# Patient Record
Sex: Female | Born: 1990 | Race: White | Hispanic: Yes | Marital: Single | Smoking: Never smoker
Health system: Southern US, Community
[De-identification: ages and names within clinical notes are randomized; demographics above are authoritative.]

## PROBLEM LIST (undated history)

## (undated) DIAGNOSIS — K819 Cholecystitis, unspecified: Secondary | ICD-10-CM

## (undated) DIAGNOSIS — O24419 Gestational diabetes mellitus in pregnancy, unspecified control: Secondary | ICD-10-CM

## (undated) DIAGNOSIS — K219 Gastro-esophageal reflux disease without esophagitis: Secondary | ICD-10-CM

## (undated) HISTORY — PX: NO PAST SURGERIES: SHX2092

---
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
## 2011-01-10 NOTE — L&D Delivery Note (Signed)
Delivery Note At 12:24 AM a viable female was delivered via Vaginal, Spontaneous Delivery (Presentation: OA ). Vigorous infant to mother's abd No nuchal APGAR: 8, 9; weight 6 lb 11 oz (3033 g).   Placenta status: Intact, Spontaneous by Deanna Harrison. Cord: 3 vessels with the following complications: None.    Anesthesia: Local  Episiotomy: None Lacerations: left side wall and 2nd degree perineal Suture Repair: 3.0 vicryl Est. Blood Loss (mL): 500cc  Mom to postpartum.  Baby to nursery-stable.  Deanna Peavler E. 04/17/2011, 1:07 AM

## 2011-02-02 LAB — HIV ANTIBODY (ROUTINE TESTING W REFLEX): HIV: NONREACTIVE

## 2011-04-14 ENCOUNTER — Encounter (HOSPITAL_COMMUNITY): Payer: Self-pay | Admitting: *Deleted

## 2011-04-14 ENCOUNTER — Emergency Department (HOSPITAL_COMMUNITY)
Admission: EM | Admit: 2011-04-14 | Discharge: 2011-04-14 | Disposition: A | Discharge status: Home / Self Care | Payer: Self-pay | Attending: Emergency Medicine | Admitting: Emergency Medicine

## 2011-04-14 DIAGNOSIS — O479 False labor, unspecified: Secondary | ICD-10-CM | POA: Insufficient documentation

## 2011-04-14 DIAGNOSIS — R109 Unspecified abdominal pain: Secondary | ICD-10-CM | POA: Insufficient documentation

## 2011-04-14 DIAGNOSIS — J45909 Unspecified asthma, uncomplicated: Secondary | ICD-10-CM | POA: Insufficient documentation

## 2011-04-14 DIAGNOSIS — K219 Gastro-esophageal reflux disease without esophagitis: Secondary | ICD-10-CM | POA: Insufficient documentation

## 2011-04-14 DIAGNOSIS — M549 Dorsalgia, unspecified: Secondary | ICD-10-CM | POA: Insufficient documentation

## 2011-04-14 HISTORY — DX: Gastro-esophageal reflux disease without esophagitis: K21.9

## 2011-04-14 NOTE — Discharge Instructions (Signed)

## 2011-04-14 NOTE — ED Provider Notes (Signed)
History     CSN: [id number]  Arrival date & time 04/14/11  2140   First MD Initiated Contact with Patient 04/14/11 2207      Chief Complaint  Patient presents with  . Contractions     HPI Pt states she began having back pain and lower abdominal pain this morning. States this is her first pregnancy. OBGYN in [place], [place]. Assoicated with [facility]. States her water has not broken   Past Medical History  Diagnosis Date  . Asthma   . GERD (gastroesophageal reflux disease)     Past Surgical History  Procedure Date  . No past surgeries     History reviewed. No pertinent family history.  History  Substance Use Topics  . Smoking status: Never Smoker   . Smokeless tobacco: Never Used  . Alcohol Use: No    OB History    Grav Para Term Preterm Abortions TAB SAB Ect Mult Living   2    1  1    0      Review of Systems  Unable to perform ROS: Other  All other systems reviewed and are negative.    Allergies  Review of patient's allergies indicates no known allergies.  Home Medications  No current outpatient prescriptions on file.  BP 115/66  Pulse 102  Temp(Src) 98.5 F (36.9 C) (Oral)  Resp 22  Ht 5' 0.5" (1.537 m)  Wt 175 lb (79.379 kg)  BMI 33.61 kg/m2  SpO2 100%  Breastfeeding? Unknown  Physical Exam  Nursing note and vitals reviewed. Constitutional: She is oriented to person, place, and time. She appears well-developed and well-nourished. No distress.  HENT:  Head: Normocephalic and atraumatic.  Eyes: Pupils are equal, round, and reactive to light.  Neck: Normal range of motion.  Cardiovascular: Normal rate and intact distal pulses.   Pulmonary/Chest: No respiratory distress.  Abdominal: Normal appearance and bowel sounds are normal. She exhibits no distension. There is no tenderness.       Gravid uterus.  No discharge.  1 cm minimal effacement.  Musculoskeletal: Normal range of motion.  Neurological: She is alert and oriented to person, place, and  time. No cranial nerve deficit.  Skin: Skin is warm and dry. No rash noted.  Psychiatric: She has a normal mood and affect. Her behavior is normal.    ED Course  Procedures (including critical care time) The rapid response nurse came to the hospital and evaluated the patient.  Patient placed on fetal monitor her no significant contractions noted.  Patient 1 cm and not dilated or water has not broken.  OB on call was consulted and felt patient could be sent home.  Patient informed of plan and will be instructed to return to [facility] should she have any further problems otherwise she was given a phone number to call Monday for followup for her pregnancy.  Labs Reviewed  ANTIBODY SCREEN  HIV ANTIBODY (ROUTINE TESTING)  GC/CHLAMYDIA PROBE AMP, GENITAL  GC/CHLAMYDIA PROBE AMP, GENITAL  STREP B DNA PROBE  RUBELLA ANTIBODY, IGM  HEPATITIS B SURFACE ANTIGEN  RPR  GC/CHLAMYDIA PROBE AMP, GENITAL   No results found.   1. Braxton Hick's contraction       MDM         Nelia Shi, MD 04/14/11 2326

## 2011-04-14 NOTE — ED Notes (Signed)
No rx given, pt and family voiced understanding to f/u with [facility] for primary [facility]YN in the area.  Also voiced understanding to f/u with [facility] for any other OB emergencies.

## 2011-04-14 NOTE — ED Notes (Signed)
Pt states she began having back pain and lower abdominal pain this morning.  States this is her first pregnancy.  OBGYN in [place], [place].  Assoicated with [facility].  States her water has not broken

## 2011-04-14 NOTE — ED Notes (Signed)
OB rapid response nurse at bedside, to check cervix and assess pt

## 2011-04-14 NOTE — ED Notes (Signed)
OB Rapid Response notified of this pt, is on her way

## 2011-04-14 NOTE — ED Notes (Signed)
OB Rapid response RN in dept

## 2011-04-15 NOTE — Progress Notes (Signed)
Pt in to [facility] c/o contraction. Pt is G2P0 @ 38.5 wks.  Pt reports good FM, no bleeding or leaking of fluid.  Pt states that ucs started around noon w/ 7/10 on pain scale.  Pt has received care in [facility] but recently moved to [place].  Pt brought PNR with her

## 2011-04-15 NOTE — Progress Notes (Signed)
ED MD notified of Dr. Dion Body order to d/c home.  Antenatal d/c orders reviewed with pt and pt and FOR verbalized understanding.  Pt instructed to call Monday am to get appointment for OB care here in [place] @ [facility] clinic and to return to [facility] for further labor concerns

## 2011-04-15 NOTE — Progress Notes (Signed)
Notified Dr. Dion Body (OB unassigned) of pt in [facility], pt OB history and current status.  MD ok w/ pt being d/c home to follow up for [facility] care with [facility].

## 2011-04-16 ENCOUNTER — Inpatient Hospital Stay (HOSPITAL_COMMUNITY)
Admission: RE | Admit: 2011-04-16 | Discharge: 2011-04-19 | DRG: 775 | Disposition: A | Discharge status: Home / Self Care | Payer: Medicaid Other | Source: Ambulatory Visit | Attending: Obstetrics and Gynecology | Admitting: Obstetrics and Gynecology

## 2011-04-16 ENCOUNTER — Encounter (HOSPITAL_COMMUNITY): Payer: Self-pay | Admitting: *Deleted

## 2011-04-16 DIAGNOSIS — O4290 Premature rupture of membranes, unspecified as to length of time between rupture and onset of labor, unspecified weeks of gestation: Secondary | ICD-10-CM | POA: Diagnosis present

## 2011-04-16 LAB — CBC
HCT: 31.8 % — ABNORMAL LOW (ref 36.0–46.0)
Hemoglobin: 9.8 g/dL — ABNORMAL LOW (ref 12.0–15.0)
MCHC: 30.8 g/dL (ref 30.0–36.0)
RBC: 4.34 MIL/uL (ref 3.87–5.11)

## 2011-04-16 MED ORDER — ONDANSETRON HCL 4 MG/2ML IJ SOLN: 4.0000 mg | Freq: Four times a day (QID) | INTRAMUSCULAR | Status: DC | PRN

## 2011-04-16 MED ORDER — FLEET ENEMA 7-19 GM/118ML RE ENEM: 1.0000 | ENEMA | RECTAL | Status: DC | PRN

## 2011-04-16 MED ORDER — LACTATED RINGERS IV SOLN: 500.0000 mL | INTRAVENOUS | Status: DC | PRN

## 2011-04-16 MED ORDER — ACETAMINOPHEN 325 MG PO TABS: 650.0000 mg | ORAL_TABLET | ORAL | Status: DC | PRN

## 2011-04-16 MED ORDER — LIDOCAINE HCL (PF) 1 % IJ SOLN
30.0000 mL | INTRAMUSCULAR | Status: DC | PRN
  Administered 2011-04-17: 30 mL via SUBCUTANEOUS
  Filled 2011-04-16: qty 30

## 2011-04-16 MED ORDER — OXYCODONE-ACETAMINOPHEN 5-325 MG PO TABS: 1.0000 | ORAL_TABLET | ORAL | Status: DC | PRN

## 2011-04-16 MED ORDER — CITRIC ACID-SODIUM CITRATE 334-500 MG/5ML PO SOLN: 30.0000 mL | ORAL | Status: DC | PRN

## 2011-04-16 MED ORDER — LACTATED RINGERS IV SOLN
INTRAVENOUS | Status: DC
  Administered 2011-04-16 (×2): via INTRAVENOUS

## 2011-04-16 MED ORDER — IBUPROFEN 600 MG PO TABS: 600.0000 mg | ORAL_TABLET | Freq: Four times a day (QID) | ORAL | Status: DC | PRN

## 2011-04-16 MED ORDER — TERBUTALINE SULFATE 1 MG/ML IJ SOLN: 0.2500 mg | Freq: Once | INTRAMUSCULAR | Status: AC | PRN

## 2011-04-16 MED ORDER — OXYTOCIN 20 UNITS IN LACTATED RINGERS INFUSION - SIMPLE
1.0000 m[IU]/min | INTRAVENOUS | Status: DC
  Administered 2011-04-16: 2 m[IU]/min via INTRAVENOUS
  Filled 2011-04-16: qty 1000

## 2011-04-16 MED ORDER — OXYTOCIN 20 UNITS IN LACTATED RINGERS INFUSION - SIMPLE: 1.0000 m[IU]/min | INTRAVENOUS | Status: DC

## 2011-04-16 MED ORDER — OXYTOCIN BOLUS FROM INFUSION
500.0000 mL | Freq: Once | INTRAVENOUS | Status: DC
  Filled 2011-04-16: qty 500

## 2011-04-16 MED ORDER — OXYTOCIN 20 UNITS IN LACTATED RINGERS INFUSION - SIMPLE
125.0000 mL/h | Freq: Once | INTRAVENOUS | Status: AC
  Administered 2011-04-17: 999 mL/h via INTRAVENOUS

## 2011-04-16 NOTE — Progress Notes (Signed)
Patient ID: Asha Grumbine, female   DOB: 07/01/1990, 21 y.o.   MRN: [id number] Moniqua Engebretsen is a 21 y.o. G2P0010 at [redacted]w[redacted]d admitted for SROM  Subjective: Some discomfort with UCs. Wants no pain med. Up walking. Leaking clear fluid.  Objective: BP 81/55  Pulse 121  Temp(Src) 97.5 F (36.4 C) (Oral)  Resp 20  Ht 5' 0.5" (1.537 m)  Wt 79.379 kg (175 lb)  BMI 33.61 kg/m2  Breastfeeding? Unknown  Fetal Heart FHR: 140 bpm, variability: moderate,  accelerations:  Present,  decelerations:  Absent   Contractions: irreg, q 1 1/2-4  SVE:   Dilation: 2 Effacement (%): 80 Station: -2 Exam by:: D Saryiah Bencosme CNM  Assessment / Plan:  Labor: Progressive effacement Fetal Wellbeing: Cat 1 Pain Control:  Coping well Expected mode of delivery: NSVD  Regan Mcbryar 04/16/2011, 3:34 PM

## 2011-04-16 NOTE — Progress Notes (Signed)
Deanna Harrison is a 21 y.o. G2P0010 at [redacted]w[redacted]d by ultrasound admitted for PROM  Subjective: Breathing with contractions, declines pain medications at this time  Objective: BP 113/68  Pulse 98  Temp(Src) 98.4 F (36.9 C) (Oral)  Resp 18  Ht 5' 0.5" (1.537 m)  Wt 175 lb (79.379 kg)  BMI 33.61 kg/m2  Breastfeeding? Unknown      FHT:  FHR: 130 bpm, variability: moderate,  accelerations:  Present,  decelerations:  Absent UC:   regular, every 3-4 minutes SVE:   Dilation: 2 Effacement (%): 80 Station: -2 Exam by:: Deanna Coe RN  Labs: Lab Results  Component Value Date   WBC 12.6* 04/16/2011   HGB 9.8* 04/16/2011   HCT 31.8* 04/16/2011   MCV 73.3* 04/16/2011   PLT 335 04/16/2011    Assessment / Plan: PROM at term  Labor: Pitocin started at 1900 Preeclampsia:  n/a Fetal Wellbeing:  Category I Pain Control:  Labor support without medications I/D:  n/a Anticipated MOD:  NSVD  Deanna Cumberland E. 04/16/2011, 8:29 PM

## 2011-04-16 NOTE — MAU Provider Note (Signed)
Attestation of Attending Supervision of Advanced Practitioner: Evaluation and management procedures were performed by the PA/NP/CNM/OB Fellow under my supervision/collaboration. Chart reviewed and agree with management and plan.  Kemuel Buchmann V 04/16/2011 12:17 PM

## 2011-04-16 NOTE — MAU Provider Note (Signed)
See admission H&P for G2P0010 at 38wks with SROM

## 2011-04-16 NOTE — Progress Notes (Signed)
Patient ID: Deanna Harrison, female   DOB: Nov 10, 1990, 21 y.o.   MRN: [id number] Still walking around in room and appears comfortable. Leaking small amount. Hungry.  Afebrile FHR reactive UCs irregular.   Discussed POC with pt who agrees to start pitocin augmentation after eating light meal.

## 2011-04-16 NOTE — Progress Notes (Signed)
Pt up walking in room by choice with sister at bedside for support. Continues to labor without pain medication interventions per pt request.

## 2011-04-16 NOTE — H&P (Signed)
Attestation of Attending Supervision of Advanced Practitioner: Evaluation and management procedures were performed by the PA/NP/CNM/OB Fellow under my supervision/collaboration. Chart reviewed and agree with management and plan.  Veronnica Hennings V 04/16/2011 12:16 PM

## 2011-04-16 NOTE — H&P (Signed)
Deanna Harrison is a 21 y.o. female at [redacted]w[redacted]d by LMP confirmed by 13 wk [place] presenting for SROM today 0915 large amount clear fluid. PNC in [place], uncomplicated, records available  Maternal Medical History:  Reason for admission: Reason for admission: rupture of membranes.  Reason for Admission:   nauseaContractions: Onset was 1-2 hours ago.   Frequency: irregular.    Fetal activity: Perceived fetal activity is normal.   Last perceived fetal movement was within the past hour.    Prenatal complications: no prenatal complications Prenatal Complications - Diabetes: none.    OB History    Grav Para Term Preterm Abortions TAB SAB Ect Mult Living   2    1  1    0     Past Medical History  Diagnosis Date  . Asthma   . GERD (gastroesophageal reflux disease)    Past Surgical History  Procedure Date  . No past surgeries    Family History: family history is not on file. Social History:  reports that she has never smoked. She has never used smokeless tobacco. She reports that she does not drink alcohol or use illicit drugs.  Review of Systems  Constitutional: Negative for fever.  Eyes: Negative for blurred vision.  Gastrointestinal: Negative for nausea and vomiting.  Genitourinary: Negative for dysuria.  Skin: Negative for rash.  Neurological: Negative for headaches.    Dilation: 2 Effacement (%): 50 Station: -2 Exam by:: d Evonte Prestage, cnm Temperature 98.6 F (37 C), temperature source Oral, resp. rate 20, height 5' 0.5" (1.537 m), weight 79.379 kg (175 lb), unknown if currently breastfeeding. Maternal Exam:  Uterine Assessment: Contraction strength is moderate.  Contraction frequency is irregular.   Abdomen: Estimated fetal weight is EFW 7#.   Fetal presentation: vertex  Introitus: Normal vulva. Vagina is positive for vaginal discharge.  Ferning test: positive.  Nitrazine test: not done. Amniotic fluid character: clear.  Pelvis: adequate for delivery.   Cervix: Cervix evaluated  by digital exam.   Posterior, 2/50/-2, large amount clear AF  Physical Exam  Constitutional: She is oriented to person, place, and time. She appears well-developed and well-nourished. No distress.  HENT:  Head: Normocephalic.  Neck: Normal range of motion.  Respiratory: Breath sounds normal.  GI: There is no tenderness.  Genitourinary: Uterus normal. Vaginal discharge found.       Thick white discharge  Musculoskeletal: Normal range of motion.  Neurological: She is alert and oriented to person, place, and time.  Skin: Skin is warm and dry.  Psychiatric: She has a normal mood and affect.    Prenatal labs: ABO, Rh: --/--/A POS (06/15 1315) Antibody: Negative (09/27 0000) Rubella: Immune (09/27 0000) RPR: Nonreactive (09/26 0000)  HBsAg: Negative (09/27 0000)  HIV: Non-reactive (01/24 0000)  GBS: Negative (03/18 0000)  1 hr gluc 138 3HOGTT: 69/144/122/89  FHR: 130 baseline, moderate variability, assessing UC's: palpate mild-moderate, assessing frequency  Assessment/Plan: G1P0010 at [redacted]w[redacted]d with SROM assessing for early labor Fetal parameters reassuring  Admit for expectant management   Freman Lapage 04/16/2011, 10:50 AM

## 2011-04-16 NOTE — Progress Notes (Signed)
Callen Zuba is a 21 y.o. G2P0010 at [redacted]w[redacted]d   Subjective: Uncomfortable with contractions  Objective: BP 115/57  Pulse 90  Temp(Src) 98.1 F (36.7 C) (Oral)  Resp 20  Ht 5' 0.5" (1.537 m)  Wt 175 lb (79.379 kg)  BMI 33.61 kg/m2  Breastfeeding? Unknown      FHT:  FHR: 140 bpm, variability: moderate,  accelerations:  Present,  decelerations:  Absent UC:   regular, every 1-3 minutes SVE:   Dilation: 6 Effacement (%): 90 Station: 0 Exam by:: [employer]: Lab Results  Component Value Date   WBC 12.6* 04/16/2011   HGB 9.8* 04/16/2011   HCT 31.8* 04/16/2011   MCV 73.3* 04/16/2011   PLT 335 04/16/2011    Assessment / Plan: Augmentation of labor, progressing well  Labor: Progressing normally Preeclampsia:  n/a Fetal Wellbeing:  Category I Pain Control:  Labor support without medications I/D:  n/a Anticipated MOD:  NSVD  Taysia Rivere E. 04/16/2011, 11:34 PM

## 2011-04-17 DIAGNOSIS — O4290 Premature rupture of membranes, unspecified as to length of time between rupture and onset of labor, unspecified weeks of gestation: Secondary | ICD-10-CM

## 2011-04-17 MED ORDER — SENNOSIDES-DOCUSATE SODIUM 8.6-50 MG PO TABS
2.0000 | ORAL_TABLET | Freq: Every day | ORAL | Status: DC
  Administered 2011-04-17 – 2011-04-18 (×2): 2 via ORAL

## 2011-04-17 MED ORDER — TETANUS-DIPHTH-ACELL PERTUSSIS 5-2.5-18.5 LF-MCG/0.5 IM SUSP: 0.5000 mL | Freq: Once | INTRAMUSCULAR | Status: DC

## 2011-04-17 MED ORDER — BENZOCAINE-MENTHOL 20-0.5 % EX AERO: 1.0000 "application " | INHALATION_SPRAY | CUTANEOUS | Status: DC | PRN

## 2011-04-17 MED ORDER — ONDANSETRON HCL 4 MG/2ML IJ SOLN: 4.0000 mg | INTRAMUSCULAR | Status: DC | PRN

## 2011-04-17 MED ORDER — LANOLIN HYDROUS EX OINT: TOPICAL_OINTMENT | CUTANEOUS | Status: DC | PRN

## 2011-04-17 MED ORDER — ONDANSETRON HCL 4 MG PO TABS: 4.0000 mg | ORAL_TABLET | ORAL | Status: DC | PRN

## 2011-04-17 MED ORDER — PRENATAL MULTIVITAMIN CH
1.0000 | ORAL_TABLET | Freq: Every day | ORAL | Status: DC
  Administered 2011-04-17 – 2011-04-19 (×3): 1 via ORAL
  Filled 2011-04-17 (×3): qty 1

## 2011-04-17 MED ORDER — BENZOCAINE-MENTHOL 20-0.5 % EX AERO
INHALATION_SPRAY | CUTANEOUS | Status: AC
  Filled 2011-04-17: qty 56

## 2011-04-17 MED ORDER — ZOLPIDEM TARTRATE 5 MG PO TABS: 5.0000 mg | ORAL_TABLET | Freq: Every evening | ORAL | Status: DC | PRN

## 2011-04-17 MED ORDER — OXYCODONE-ACETAMINOPHEN 5-325 MG PO TABS: 1.0000 | ORAL_TABLET | ORAL | Status: DC | PRN

## 2011-04-17 MED ORDER — SIMETHICONE 80 MG PO CHEW: 80.0000 mg | CHEWABLE_TABLET | ORAL | Status: DC | PRN

## 2011-04-17 MED ORDER — DIPHENHYDRAMINE HCL 25 MG PO CAPS: 25.0000 mg | ORAL_CAPSULE | Freq: Four times a day (QID) | ORAL | Status: DC | PRN

## 2011-04-17 MED ORDER — IBUPROFEN 600 MG PO TABS
600.0000 mg | ORAL_TABLET | Freq: Four times a day (QID) | ORAL | Status: DC
  Administered 2011-04-17 – 2011-04-19 (×10): 600 mg via ORAL
  Filled 2011-04-17 (×10): qty 1

## 2011-04-17 MED ORDER — WITCH HAZEL-GLYCERIN EX PADS: 1.0000 "application " | MEDICATED_PAD | CUTANEOUS | Status: DC | PRN

## 2011-04-17 MED ORDER — DIBUCAINE 1 % RE OINT: 1.0000 "application " | TOPICAL_OINTMENT | RECTAL | Status: DC | PRN

## 2011-04-17 NOTE — Progress Notes (Signed)
UR chart review completed.  

## 2011-04-18 ENCOUNTER — Encounter (HOSPITAL_COMMUNITY): Payer: Self-pay | Admitting: *Deleted

## 2011-04-19 MED ORDER — PRENATAL MULTIVITAMIN CH: 1.0000 | ORAL_TABLET | Freq: Every day | ORAL | Status: DC

## 2011-04-19 MED ORDER — IBUPROFEN 600 MG PO TABS: 600.0000 mg | ORAL_TABLET | Freq: Four times a day (QID) | ORAL | Status: AC

## 2011-04-19 MED ORDER — DOCUSATE SODIUM 100 MG PO CAPS: 100.0000 mg | ORAL_CAPSULE | Freq: Two times a day (BID) | ORAL | Status: AC

## 2011-04-19 NOTE — Discharge Instructions (Signed)

## 2011-04-19 NOTE — Discharge Summary (Signed)
Obstetric Discharge Summary Reason for Admission: onset of labor Prenatal Procedures: none Intrapartum Procedures: spontaneous vaginal delivery Postpartum Procedures: none Complications-Operative and Postpartum: 2nd degree perineal laceration Hemoglobin  Date Value Range Status  04/16/2011 9.8* 12.0-15.0 (g/dL) Final     HCT  Date Value Range Status  04/16/2011 31.8* 36.0-46.0 (%) Final    Physical Exam:  General: alert, cooperative and no distress Lochia: appropriate Uterine Fundus: firm Incision: N/a DVT Evaluation: No evidence of DVT seen on physical exam. Negative Homan's sign. No cords or calf tenderness. No significant calf/ankle edema.  Discharge Diagnoses: Term Pregnancy-delivered  Discharge Information: Date: 04/19/2011 Activity: pelvic rest Diet: routine Medications: PNV, Ibuprofen and Colace Condition: stable Instructions: refer to practice specific booklet Discharge to: home Feeding: Breast Contraception: Mirena Follow-up Information    Follow up with [facility] @. Schedule an appointment as soon as possible for a visit in 5 weeks. (make appt for follow up at [facility])          Newborn Data: Live born female  Birth Weight: 6 lb 11 oz (3033 g) APGAR: 8, 9  Home with mother.  MATTHEWS,CODY 04/19/2011, 7:41 AM  I have seen and examined this patient and I agree with the above. Cam Hai 8:02 AM 04/19/2011

## 2013-05-05 ENCOUNTER — Other Ambulatory Visit: Payer: Self-pay | Admitting: Nurse Practitioner

## 2013-05-05 DIAGNOSIS — N63 Unspecified lump in unspecified breast: Secondary | ICD-10-CM

## 2013-05-05 DIAGNOSIS — N644 Mastodynia: Secondary | ICD-10-CM

## 2013-05-07 ENCOUNTER — Ambulatory Visit
Admission: RE | Admit: 2013-05-07 | Discharge: 2013-05-07 | Disposition: A | Discharge status: Home / Self Care | Payer: No Typology Code available for payment source | Source: Ambulatory Visit | Attending: Nurse Practitioner | Admitting: Nurse Practitioner

## 2013-05-07 DIAGNOSIS — N63 Unspecified lump in unspecified breast: Secondary | ICD-10-CM

## 2013-05-07 DIAGNOSIS — N644 Mastodynia: Secondary | ICD-10-CM

## 2013-09-21 ENCOUNTER — Emergency Department (HOSPITAL_COMMUNITY)
Admission: EM | Admit: 2013-09-21 | Discharge: 2013-09-21 | Disposition: A | Discharge status: Home / Self Care | Payer: Self-pay | Attending: Emergency Medicine | Admitting: Emergency Medicine

## 2013-09-21 ENCOUNTER — Encounter (HOSPITAL_COMMUNITY): Payer: Self-pay | Admitting: Emergency Medicine

## 2013-09-21 DIAGNOSIS — Z79899 Other long term (current) drug therapy: Secondary | ICD-10-CM | POA: Insufficient documentation

## 2013-09-21 DIAGNOSIS — J45909 Unspecified asthma, uncomplicated: Secondary | ICD-10-CM | POA: Insufficient documentation

## 2013-09-21 DIAGNOSIS — Z792 Long term (current) use of antibiotics: Secondary | ICD-10-CM | POA: Insufficient documentation

## 2013-09-21 DIAGNOSIS — Z3202 Encounter for pregnancy test, result negative: Secondary | ICD-10-CM | POA: Insufficient documentation

## 2013-09-21 DIAGNOSIS — H81399 Other peripheral vertigo, unspecified ear: Secondary | ICD-10-CM

## 2013-09-21 DIAGNOSIS — R1013 Epigastric pain: Secondary | ICD-10-CM

## 2013-09-21 DIAGNOSIS — Z8719 Personal history of other diseases of the digestive system: Secondary | ICD-10-CM | POA: Insufficient documentation

## 2013-09-21 DIAGNOSIS — N390 Urinary tract infection, site not specified: Secondary | ICD-10-CM

## 2013-09-21 LAB — COMPREHENSIVE METABOLIC PANEL
ALT: 17 U/L (ref 0–35)
ANION GAP: 12 (ref 5–15)
AST: 21 U/L (ref 0–37)
Albumin: 3.8 g/dL (ref 3.5–5.2)
Alkaline Phosphatase: 117 U/L (ref 39–117)
BUN: 13 mg/dL (ref 6–23)
CO2: 24 mEq/L (ref 19–32)
CREATININE: 0.67 mg/dL (ref 0.50–1.10)
Calcium: 9.3 mg/dL (ref 8.4–10.5)
Chloride: 104 mEq/L (ref 96–112)
GFR calc non Af Amer: >90 mL/min (ref >90)
GLUCOSE: 130 mg/dL — AB (ref 70–99)
Potassium: 4.5 mEq/L (ref 3.7–5.3)
Sodium: 140 mEq/L (ref 137–147)
TOTAL PROTEIN: 8.7 g/dL — AB (ref 6.0–8.3)
Total Bilirubin: <0.2 mg/dL — ABNORMAL LOW (ref 0.3–1.2)

## 2013-09-21 LAB — CBC WITH DIFFERENTIAL/PLATELET
Basophils Absolute: 0 10*3/uL (ref 0.0–0.1)
Basophils Relative: 0 % (ref 0–1)
EOS ABS: 0.1 10*3/uL (ref 0.0–0.7)
EOS PCT: 1 % (ref 0–5)
HCT: 38.4 % (ref 36.0–46.0)
HEMOGLOBIN: 12.9 g/dL (ref 12.0–15.0)
LYMPHS ABS: 2.3 10*3/uL (ref 0.7–4.0)
Lymphocytes Relative: 23 % (ref 12–46)
MCH: 27.4 pg (ref 26.0–34.0)
MCHC: 33.6 g/dL (ref 30.0–36.0)
MCV: 81.5 fL (ref 78.0–100.0)
MONO ABS: 0.6 10*3/uL (ref 0.1–1.0)
MONOS PCT: 6 % (ref 3–12)
Neutro Abs: 7.2 10*3/uL (ref 1.7–7.7)
Neutrophils Relative %: 70 % (ref 43–77)
Platelets: 348 10*3/uL (ref 150–400)
RBC: 4.71 MIL/uL (ref 3.87–5.11)
RDW: 13.6 % (ref 11.5–15.5)
WBC: 10.3 10*3/uL (ref 4.0–10.5)

## 2013-09-21 LAB — URINALYSIS, ROUTINE W REFLEX MICROSCOPIC
BILIRUBIN URINE: NEGATIVE
GLUCOSE, UA: NEGATIVE mg/dL
HGB URINE DIPSTICK: NEGATIVE
KETONES UR: NEGATIVE mg/dL
Nitrite: NEGATIVE
PH: 5.5 (ref 5.0–8.0)
Protein, ur: NEGATIVE mg/dL
Specific Gravity, Urine: 1.019 (ref 1.005–1.030)
Urobilinogen, UA: 0.2 mg/dL (ref 0.0–1.0)

## 2013-09-21 LAB — URINE MICROSCOPIC-ADD ON

## 2013-09-21 LAB — LIPASE, BLOOD: Lipase: 30 U/L (ref 11–59)

## 2013-09-21 LAB — POC URINE PREG, ED: Preg Test, Ur: NEGATIVE

## 2013-09-21 MED ORDER — GI COCKTAIL ~~LOC~~
30.0000 mL | Freq: Once | ORAL | Status: AC
  Administered 2013-09-21: 30 mL via ORAL
  Filled 2013-09-21: qty 30

## 2013-09-21 MED ORDER — ONDANSETRON 4 MG PO TBDP
4.0000 mg | ORAL_TABLET | Freq: Once | ORAL | Status: AC
  Administered 2013-09-21: 4 mg via ORAL
  Filled 2013-09-21: qty 1

## 2013-09-21 MED ORDER — MECLIZINE HCL 50 MG PO TABS: 50.0000 mg | ORAL_TABLET | Freq: Three times a day (TID) | ORAL | Status: DC | PRN

## 2013-09-21 MED ORDER — SULFAMETHOXAZOLE-TRIMETHOPRIM 800-160 MG PO TABS: 1.0000 | ORAL_TABLET | Freq: Two times a day (BID) | ORAL | Status: DC

## 2013-09-21 MED ORDER — MECLIZINE HCL 25 MG PO TABS
25.0000 mg | ORAL_TABLET | Freq: Once | ORAL | Status: AC
  Administered 2013-09-21: 25 mg via ORAL
  Filled 2013-09-21: qty 1

## 2013-09-21 MED ORDER — SODIUM CHLORIDE 0.9 % IV BOLUS (SEPSIS): 1000.0000 mL | Freq: Once | INTRAVENOUS | Status: DC

## 2013-09-21 NOTE — ED Provider Notes (Signed)
Complains of burning at epigastrium typical of GERD she's had in the past. She feels much improved since treatment with GI cocktail here  Doug Sou, MD 09/21/13 0945

## 2013-09-21 NOTE — ED Provider Notes (Signed)
CSN: [id number]     Arrival date & time 09/21/13  7829 History   First MD Initiated Contact with Patient 09/21/13 [phone]     Chief Complaint  Patient presents with  . Abdominal Pain  . Emesis     HPI Deanna Harrison is a 23 y.o. female with PMH of GERD, asthma presenting with epigastric abdominal pain that is burning that started this morning. It is typical of her acid reflux symptoms. Pain radiates into her back and is severe. Patient is nauseated without vomiting. No change in stool, melena or hematochezia. Reports eating meat and chips for dinner last night. Patient denies alcohol use or NSAID use. Patient is not a smoker. Patient takes malox but no other antiacid medications. Patient also complains of dysuria, frequency. Patient also has dizziness that started with epigastric pain. It is the room spining and is worse with movement. No cough, congestion, fevers or recent illness. Dizziness lasts less than a min or two. No increase in vaginal discharge or bleeding.   Past Medical History  Diagnosis Date  . Asthma   . GERD (gastroesophageal reflux disease)    Past Surgical History  Procedure Laterality Date  . No past surgeries     No family history on file. History  Substance Use Topics  . Smoking status: Never Smoker   . Smokeless tobacco: Never Used  . Alcohol Use: No   OB History   Grav Para Term Preterm Abortions TAB SAB Ect Mult Living   Review of Systems  Constitutional: Negative for fever and chills.  HENT: Negative for congestion and rhinorrhea.   Eyes: Negative for visual disturbance.  Respiratory: Negative for cough and shortness of breath.   Cardiovascular: Negative for chest pain and palpitations.  Gastrointestinal: Positive for nausea and abdominal pain. Negative for vomiting and diarrhea.  Genitourinary: Positive for dysuria. Negative for hematuria.  Musculoskeletal: Negative for back pain and gait problem.  Skin: Negative for rash.   Neurological: Positive for dizziness. Negative for weakness and headaches.      Allergies  Review of patient's allergies indicates no known allergies.  Home Medications   Prior to Admission medications   Medication Sig Start Date End Date Taking? Authorizing Provider  meclizine (ANTIVERT) 50 MG tablet Take 1 tablet (50 mg total) by mouth 3 (three) times daily as needed. 09/21/13   Louann Sjogren, PA-C  sulfamethoxazole-trimethoprim (SEPTRA DS) 800-160 MG per tablet Take 1 tablet by mouth every 12 (twelve) hours. 09/21/13   Louann Sjogren, PA-C   BP 107/74  Pulse 85  Temp(Src) 98.7 F (37.1 C) (Oral)  Resp 18  Ht  (1.626 m)  Wt 173 lb (78.472 kg)  BMI 29.68 kg/m2  SpO2 100% Physical Exam  Nursing note and vitals reviewed. Constitutional: She appears well-developed and well-nourished. No distress.  HENT:  Head: Normocephalic and atraumatic.  Mouth/Throat: Oropharynx is clear and moist.  Eyes: Conjunctivae and EOM are normal. Pupils are equal, round, and reactive to light. Right eye exhibits no discharge. Left eye exhibits no discharge. No scleral icterus.  Cardiovascular: Normal rate, regular rhythm and normal heart sounds.   Pulmonary/Chest: Effort normal and breath sounds normal. No respiratory distress. She has no wheezes.  Abdominal: Soft. Bowel sounds are normal. She exhibits no distension.  Epigastric tenderness without rebound or guarding.  Musculoskeletal: Normal range of motion. She exhibits no tenderness.  Neurological: She is alert. No cranial  nerve deficit. She exhibits normal muscle tone. Coordination normal.  Strength 5/5 in upper and lower extremities. Sensation intact. Intact rapid alternating movements, finger to nose, and heel to shin. Negative Romberg. Normal gait. Dizziness improved with visual fixation.   Skin: Skin is warm and dry. She is not diaphoretic.  Psychiatric: She has a normal mood and affect. Her behavior is normal.    ED Course   Procedures (including critical care time) Labs Review Labs Reviewed  URINALYSIS, ROUTINE W REFLEX MICROSCOPIC - Abnormal; Notable for the following:    Leukocytes, UA SMALL (*)    All other components within normal limits  COMPREHENSIVE METABOLIC PANEL - Abnormal; Notable for the following:    Glucose, Bld 130 (*)    Total Protein 8.7 (*)    Total Bilirubin <0.2 (*)    All other components within normal limits  URINE MICROSCOPIC-ADD ON - Abnormal; Notable for the following:    Squamous Epithelial / LPF FEW (*)    Bacteria, UA FEW (*)    All other components within normal limits  CBC WITH DIFFERENTIAL  LIPASE, BLOOD  POC URINE PREG, ED    Imaging Review No results found.   EKG Interpretation None     Meds given in ED:  Medications  ondansetron (ZOFRAN-ODT) disintegrating tablet 4 mg (4 mg Oral Given 09/21/13 0902)  gi cocktail (Maalox,Lidocaine,Donnatal) (30 mLs Oral Given 09/21/13 0902)  meclizine (ANTIVERT) tablet 25 mg (25 mg Oral Given 09/21/13 0955)    Discharge Medication List as of 09/21/2013  9:57 AM    START taking these medications   Details  meclizine (ANTIVERT) 50 MG tablet Take 1 tablet (50 mg total) by mouth 3 (three) times daily as needed., Starting 09/21/2013, Until Discontinued, Print    sulfamethoxazole-trimethoprim (SEPTRA DS) 800-160 MG per tablet Take 1 tablet by mouth every 12 (twelve) hours., Starting 09/21/2013, Until Discontinued, Print          MDM   Final diagnoses:  UTI (lower urinary tract infection)  Epigastric abdominal pain  Peripheral vertigo, unspecified laterality   Patient is nontoxic, nonseptic appearing, in no apparent distress.  Patient's pain and other symptoms adequately managed in emergency department.  Labs, imaging and vitals reviewed.  Patient does not meet the SIRS or Sepsis criteria.  On repeat exam patient does not have a surgical abdomin and there are no peritoneal signs.  No indication of appendicitis, bowel  obstruction, bowel perforation, cholecystitis, diverticulitis, PID or ectopic pregnancy.  Pain is typical of GERD pain and improved with GI cocktail. Patient has not taken PPIs before and will recommend OTC PPI and establish care and follow up with PCP. Resources provided.   I have also discussed reasons to return immediately to the ER.  Patient expresses understanding and agrees with plan. Pt has been diagnosed with a UTI. Pt is afebrile, no CVA tenderness, normotensive, and denies emesis. Patient with dysuria and frequency. Pt to be dc home with antibiotics and instructions to follow up with PCP if symptoms persist. Patient diagnosed with peripheral vertigo. Vertigo is short in duration and increased with movement and turning head. She has completely benign neurological exam. Not concerned with central vertigo. Patient given dose of meclizine in ED but was ready to go home so given a script and told to take if the meclizine helps. Follow up with PCP.  Discussed return precautions with patient. Discussed all results and patient verbalizes understanding and agrees with plan.  This is a shared patient. This patient was  discussed with the physician, Dr. Ethelda Chick who saw and evaluated the patient.       Louann Sjogren, PA-C 09/21/13 [phone]

## 2013-09-21 NOTE — ED Notes (Signed)
Pt. Stated, stomach pain and throwing up this morning

## 2013-09-21 NOTE — ED Provider Notes (Signed)
Medical screening examination/treatment/procedure(s) were conducted as a shared visit with non-physician practitioner(s) and myself.  I personally evaluated the patient during the encounter.   EKG Interpretation None       Marisah Laker, MD 09/21/13 1655 

## 2013-09-21 NOTE — Discharge Instructions (Signed)
Return to the emergency room with worsening of symptoms, new symptoms or with symptoms that are concerning, fevers, nausea and vomiting, unable to keep food or fluids down. Worsening dizziness. Start taking Over the counter (OTC) Prilosec daily. Avoid spicy, acidic foods. Avoid alcohol and NSAIDS.  Please call your doctor for a followup appointment within 24-48 hours. When you talk to your doctor please let them know that you were seen in the emergency department and have them acquire all of your records so that they can discuss the findings with you and formulate a treatment plan to fully care for your new and ongoing problems. If you do not have a PCP call the number below to esablish care and follow up. Follow up with Primary care provider for persistent dizziness.   Emergency Department Resource Guide 1) Find a Doctor and Pay Out of Pocket Although you won't have to find out who is covered by your insurance plan, it is a good idea to ask around and get recommendations. You will then need to call the office and see if the doctor you have chosen will accept you as a new patient and what types of options they offer for patients who are self-pay. Some doctors offer discounts or will set up payment plans for their patients who do not have insurance, but you will need to ask so you aren't surprised when you get to your appointment.  2) Contact Your Local Health Department Not all health departments have doctors that can see patients for sick visits, but many do, so it is worth a call to see if yours does. If you don't know where your local health department is, you can check in your phone book. The CDC also has a tool to help you locate your state's health department, and many state websites also have listings of all of their local health departments.  3) Find a Walk-in Clinic If your illness is not likely to be very severe or complicated, you may want to try a walk in clinic. These are popping up all  over the country in pharmacies, drugstores, and shopping centers. They're usually staffed by nurse practitioners or physician assistants that have been trained to treat common illnesses and complaints. They're usually fairly quick and inexpensive. However, if you have serious medical issues or chronic medical problems, these are probably not your best option.  No Primary Care Doctor: - Call Health Connect at  [phone] - they can help you locate a primary care doctor that  accepts your insurance, provides certain services, etc. - Physician Referral Service- [phone]  Chronic Pain Problems: Organization         Address  Phone   Notes  [facility]  [phone] Patients need to be referred by their primary care doctor.   Medication Assistance: Organization         Address  Phone   Notes  [facility] Medication [facility] [address] [place] [place], [place] [address] [phone] --Must be a resident of [facility] -- Must have NO insurance coverage whatsoever (no Medicaid/ Medicare, etc.) -- The pt. MUST have a primary care doctor that directs their care regularly and follows them in the community   [facility]  [phone]   [employer]  [phone]    Agencies that provide inexpensive medical care: Organization         Address  Phone   Notes  [facility]  [phone]  [facility]    [phone]) [phone]   [facility] [address] [place], [place] [address] [phone]) [phone]   [facility] [address], [place] [phone]) [phone]   [facility]anned Parenthood    [phone]) [phone]   [facility]    [phone](336) [phone]   [facility]  [address], [place] Phone:  [phone]) [phone], Fax:  [phone](336) [phone] Hours of Operation:  9 am - 6 pm, M-F.  Also accepts Medicaid/Medicare and self-pay.  [facility]  [address], [place] Phone: [phone]) [phone], Fax: [phone]. Hours of Operation:  8:30 am - 5:30 pm, M-F.  Also accepts Medicaid and self-pay.  [facility] [place] [address], [place] Phone: [phone](336) [phone]   [facility] [address], [place], [place] [phone], Ext. [phone] Mondays & Thursdays: 7-9 AM.  First 15 patients are seen on a first come, first serve basis.    Medicaid-accepting [facility] Providers:  Organization         Address  Phone   Notes  [facility] [address], [place] [phone]) [phone] Also accepts self-pay patients.  [facility] [address], [place]  [phone](336) [phone]   [facility] [address], [place] [phone](336) [phone]   [facility] [address] [place] [address], [place] [phone]) [phone]   Renaye RakersVeita Bland [address], [place]   [phone](336) [phone] Only accepts [place] Access [place] patients after they have their name applied to their card.   Self-Pay (no insurance) in [place] [facility]:  Organization         Address  Phone   Notes  Sickle Cell Patients, [facility] [address], [place] [phone](336) [phone]   [facility] [address], [place] [phone](336) [phone]   [facility] [place]  [address], [place] [phone]) [phone]   [facility]/Dr. Osei-Bonsu  [address] [place] [address], [place] or [phone], [place] [phone](336) [phone] Phone number for both [place] and [place] locations is the same.  [facility] [address], [place] [phone](336) [phone]   [facility]  [address] [place] [address], [place] or [address] [phone](336) [phone](336) [phone]   [facility] [address], [place] [phone](336) [phone], phone; [phone]) [phone], fax Sees patients 1st and 3rd Saturday of every  month.  Must not qualify for public or private insurance (i.e. Medicaid, Medicare, [place] Health Choice, Veterans' Benefits)  Household income should be no more than 200% of the poverty level The clinic cannot treat you if you are pregnant or think you are pregnant  Sexually transmitted diseases are not treated at the clinic.    Dental Care: Organization         Address  Phone  Notes  [facility] of [facility] [address], [place] [phone]) [phone] Accepts children up to age 22 who are enrolled in [place] or [place] Health Choice; pregnant women with a Medicaid card; and children who have applied for Medicaid or [place] Health Choice, but were declined, whose parents can pay a reduced fee at time of service.  [facility] [place]  [address],  [place] [phone] Accepts children up to age 22 who are enrolled in Medicaid or [place] Health Choice; pregnant women with a Medicaid card; and children who have applied for Medicaid or [place] Health Choice, but were declined, whose parents can pay a reduced fee at time of service.  [facility]  [address] [place], [place] [phone] Patients are seen by appointment only. Walk-ins are not accepted. [facility] will see patients 58 years of age and older. Monday - Tuesday (8am-5pm) Most Wednesdays (8:30-5pm) $30 per visit, cash only  [facility]  [address], [facility] [phone] Patients are seen by appointment only. Walk-ins are not accepted. [facility] will see patients 45 years of age and older. One Wednesday Evening (Monthly: Volunteer Based).  $30 per visit, cash only  [employer]  [phone] for adults; Children under age 75, call Graduate Pediatric Dentistry at [phone]. Children aged 9-14, please call [phone] to request a pediatric application.  Dental  services are provided in all areas of dental care including fillings, crowns and bridges, complete and partial dentures, implants, gum treatment, root canals, and extractions. Preventive care is also provided. Treatment is provided to both adults and children. Patients are selected via a lottery and there is often a waiting list.   [facility] [address], [place]  [phone] [email or web address]   [facility] [address] [place], [place] [phone], Ext. [phone] Second and Fourth Thursday of each month, opens at 6:30 AM; Clinic ends at 9 AM.  Patients are seen on a first-come first-served basis, and a limited number are seen during each clinic.   [facility]  [address] [place], [place] [phone]   Eligibility Requirements You must have lived in [place], [place], or [place] counties for at least the last three months.   You cannot be eligible for state or federal sponsored [place], including [employer], [place], or [employer].   You generally cannot be eligible for healthcare insurance through your employer.    How to apply: Eligibility screenings are held every Tuesday and Wednesday afternoon from 1:00 pm until 4:00 pm. You do not need an appointment for the interview!  [facility] [address], [place], [place] [phone]   [facility] Health Department  [phone]   [facility] Health Department  [phone]   [facility] Health Department  [phone]    Behavioral Health Resources in the Community: Intensive Outpatient Programs Organization         Address  Phone  Notes  [facility] Services [address] [place], [place] [phone]   [facility] Outpatient [address], [place], [place] [phone]   [facility] [address], [place], [place]  [phone]   [facility] Mental Health [address]    [place], [place] [phone] or [phone]   Substance Abuse Resources Organization         Address  Phone  Notes  Alcohol [facility] Drug Services  [phone]   [facility]  [phone]   [facility]  [phone]   Floydene Flock  [phone]   Residential & Outpatient Substance Abuse Program  [phone]   Psychological Services Organization         Address  Phone  Notes  [facility] Behavioral Health  [phone]   [facility]  [phone]   [facility] Mental [facility] [address] [place] [phone] or [phone]    Mobile Crisis Teams Organization         Address  Phone  Notes  [facility], Mobile Crisis Care Unit  [phone]   [facility]  [address] [place], [place] [phone]   Doristine Locks [address] [place] [phone]    Self-Help/Support Groups Organization         Address  Phone             Notes  [facility] of [place] - variety of support groups  [phone]- I7437963 Call for more information  Narcotics Anonymous (NA), [facility] [address], [employer] [place]  2 meetings at this location   Statistician         Address  Phone  Notes  [facility] [address],    [place]  [phone]   [facility]  [address] [place] [address], [place], [place] [phone]   [facility] [address] [place], [place] [phone] Admissions: 8am-3pm M-F  [facility] [address]    [place], [place] [phone]   [facility] [address] [place], [place], [place] [phone]   [facility] [address],  [place], [place] [phone]   [facility] - Intensive Outpatient [address], [place], [place] [phone]   [facility] [address] [place]  [place], [place]  [phone] or [phone]   [facility] ([facility]) [address], [place], [place] [phone] Accepts Medicaid  [facility] [address],  [place] [phone] Substance Abuse/Addiction Treatment   [facility] Organization         Address  Phone  Notes  [facility]  [phone]   Angie Fava, PhD [address]vin Kn[address] [place], [place]   [phone] or [phone]   [facility] Behavioral   [address] [place], [place] [phone]   [facility] [address] [place], [place] [phone] Insurance/Medicaid/sponsorship through [facility] Families [address]                                    [place], [place] [phone] Therapy/tele-psych/case  [facility] [address], [place] [phone]    Dr. Lolly Mustache  [phone]   [facility] [place]  [employer] [facility] Dept. 1) [address], [place] 2) [address], [place] 3)  [address], [place] [phone]  [phone]   [facility] Child Abuse Hotline 609-437-3034 or 743-682-1945 (After Hours)

## 2013-11-10 ENCOUNTER — Encounter (HOSPITAL_COMMUNITY): Payer: Self-pay | Admitting: Emergency Medicine

## 2014-01-09 NOTE — L&D Delivery Note (Signed)
Delivery Note  Patient presented at 39110w0d in active labor. She had SROM at home around 0200 (most likely her chorion). AROM occurred at 0243, after which patient's cervix was dilated to 8 cm. She progressed quickly without further augmentation.   At 3:24 AM a viable and healthy female was delivered via Vaginal, Spontaneous Delivery (Presentation: Left Occiput Anterior).  APGAR: 8, 9; weight pending.   Placenta status: Intact, Spontaneous.  Cord: 3 vessels with the following complications: None.  Cord pH: N/A  Anesthesia: None  Episiotomy:  N/A Lacerations: 2nd degree;Perineal Suture Repair: 2.0 vicryl Est. Blood Loss (mL):  400  Mom to postpartum.  Baby to Couplet care / Skin to Skin.  Jamelle HaringHillary M Netra Postlethwait, MD [facility], PGY-1 11/23/2014, 3:46 AM

## 2014-06-01 ENCOUNTER — Other Ambulatory Visit (HOSPITAL_COMMUNITY): Payer: Self-pay | Admitting: Urology

## 2014-06-01 DIAGNOSIS — Z3A13 13 weeks gestation of pregnancy: Secondary | ICD-10-CM

## 2014-06-01 DIAGNOSIS — Z3682 Encounter for antenatal screening for nuchal translucency: Secondary | ICD-10-CM

## 2014-06-01 DIAGNOSIS — Z3689 Encounter for other specified antenatal screening: Secondary | ICD-10-CM

## 2014-06-01 LAB — OB RESULTS CONSOLE HIV ANTIBODY (ROUTINE TESTING): HIV: NONREACTIVE

## 2014-06-01 LAB — OB RESULTS CONSOLE ABO/RH: RH Type: POSITIVE

## 2014-06-01 LAB — OB RESULTS CONSOLE ANTIBODY SCREEN: ANTIBODY SCREEN: NEGATIVE

## 2014-06-01 LAB — OB RESULTS CONSOLE HEPATITIS B SURFACE ANTIGEN: HEP B S AG: NEGATIVE

## 2014-06-01 LAB — OB RESULTS CONSOLE GC/CHLAMYDIA
CHLAMYDIA, DNA PROBE: NEGATIVE
Gonorrhea: NEGATIVE

## 2014-06-01 LAB — OB RESULTS CONSOLE RPR: RPR: NONREACTIVE

## 2014-06-01 LAB — OB RESULTS CONSOLE RUBELLA ANTIBODY, IGM: Rubella: IMMUNE

## 2014-06-03 ENCOUNTER — Encounter (HOSPITAL_COMMUNITY): Payer: Self-pay

## 2014-06-03 ENCOUNTER — Ambulatory Visit (HOSPITAL_COMMUNITY)
Admission: RE | Admit: 2014-06-03 | Discharge: 2014-06-03 | Disposition: A | Discharge status: Home / Self Care | Payer: Medicaid Other | Source: Ambulatory Visit | Attending: Physician Assistant | Admitting: Physician Assistant

## 2014-06-03 ENCOUNTER — Ambulatory Visit (HOSPITAL_COMMUNITY): Admission: RE | Admit: 2014-06-03 | Payer: Medicaid Other | Source: Ambulatory Visit

## 2014-06-03 DIAGNOSIS — Z3A13 13 weeks gestation of pregnancy: Secondary | ICD-10-CM | POA: Insufficient documentation

## 2014-06-03 DIAGNOSIS — O09291 Supervision of pregnancy with other poor reproductive or obstetric history, first trimester: Secondary | ICD-10-CM | POA: Diagnosis not present

## 2014-06-03 DIAGNOSIS — Z36 Encounter for antenatal screening of mother: Secondary | ICD-10-CM | POA: Insufficient documentation

## 2014-06-03 DIAGNOSIS — Z3682 Encounter for antenatal screening for nuchal translucency: Secondary | ICD-10-CM

## 2014-06-05 ENCOUNTER — Ambulatory Visit (HOSPITAL_COMMUNITY): Payer: Self-pay

## 2014-07-09 ENCOUNTER — Ambulatory Visit (HOSPITAL_COMMUNITY)
Admission: RE | Admit: 2014-07-09 | Discharge: 2014-07-09 | Disposition: A | Discharge status: Home / Self Care | Payer: Medicaid Other | Source: Ambulatory Visit | Attending: Urology | Admitting: Urology

## 2014-07-09 ENCOUNTER — Inpatient Hospital Stay (HOSPITAL_COMMUNITY): Admission: RE | Admit: 2014-07-09 | Payer: Self-pay | Source: Ambulatory Visit

## 2014-07-09 DIAGNOSIS — Z3A19 19 weeks gestation of pregnancy: Secondary | ICD-10-CM | POA: Insufficient documentation

## 2014-07-09 DIAGNOSIS — Z36 Encounter for antenatal screening of mother: Secondary | ICD-10-CM | POA: Diagnosis present

## 2014-07-09 DIAGNOSIS — Z3689 Encounter for other specified antenatal screening: Secondary | ICD-10-CM | POA: Insufficient documentation

## 2014-07-09 DIAGNOSIS — Z3A18 18 weeks gestation of pregnancy: Secondary | ICD-10-CM | POA: Insufficient documentation

## 2014-11-14 LAB — OB RESULTS CONSOLE GBS: STREP GROUP B AG: NEGATIVE

## 2014-11-19 ENCOUNTER — Inpatient Hospital Stay (HOSPITAL_COMMUNITY)
Admission: AD | Admit: 2014-11-19 | Discharge: 2014-11-20 | Disposition: A | Discharge status: Home / Self Care | Payer: Medicaid Other | Source: Ambulatory Visit | Attending: Family Medicine | Admitting: Family Medicine

## 2014-11-19 ENCOUNTER — Encounter (HOSPITAL_COMMUNITY): Payer: Self-pay

## 2014-11-19 DIAGNOSIS — Z3493 Encounter for supervision of normal pregnancy, unspecified, third trimester: Secondary | ICD-10-CM | POA: Insufficient documentation

## 2014-11-19 HISTORY — DX: Gestational diabetes mellitus in pregnancy, unspecified control: O24.419

## 2014-11-19 NOTE — MAU Note (Signed)
Pt c/o contractions every 8-10 mins. Denies LOF or vag bleeding. +FM. States she was 3cm today in office

## 2014-11-19 NOTE — MAU Note (Signed)
Recheck pt in one hour per Dr Lendell CapriceSullivan

## 2014-11-20 NOTE — Discharge Instructions (Signed)
Contracciones de Designer, multimedia (Braxton Hicks Contractions) Durante el [place], pueden presentarse contracciones uterinas que no siempre indican que est en [place].  QU SON LAS CONTRACCIONES DE BRAXTON HICKS?  Las [employer] se presentan antes del [place] de [place] se conocen como contracciones de [place] o falso trabajo de [place]. Hacia el final del embarazo (32 a 34semanas), estas contracciones pueden aparecen con ms frecuencia y volverse ms intensas. No corresponden al Aleen Campi de parto verdadero porque estas contracciones no producen el agrandamiento (la dilatacin) y el afinamiento del cuello del tero. Algunas veces, es difcil distinguirlas del trabajo de parto verdadero porque en algunos casos pueden ser [employer], y las personas tienen diferentes niveles de tolerancia al [employer] No debe sentirse avergonzada si concurre al hospital con falso trabajo de [place]. En ocasiones, la nica forma de saber si el trabajo de parto es verdadero es que el mdico determine si hay cambios en el cuello del tero. Si no hay problemas prenatales u otras complicaciones de salud asociadas con el embarazo, no habr inconvenientes si la envan a su casa con falso trabajo de parto y espera que comience el verdadero. CMO DIFERENCIAR EL TRABAJO DE PARTO FALSO DEL VERDADERO Falso trabajo de parto  Las contracciones del falso trabajo de parto duran menos y no son tan intensas como las verdaderas.  Generalmente son irregulares.  A menudo, se sienten en la parte delantera de la parte baja del abdomen y en la ingle,  y pueden desaparecer cuando camina o cambia de posicin mientras est acostada.  Las contracciones se vuelven ms dbiles y su duracin es Adult nurse a medida que el tiempo transcurre.  Por lo general, no se hacen progresivamente ms intensas, regulares y Herbalist entre s como en el caso del [place] de parto verdadero. Theodis Blaze de parto 1. Las contracciones del verdadero  trabajo de parto duran de 30 a 70segundos, son muy regulares y suelen volverse ms intensas, y [place] su frecuencia. 2. No desaparecen cuando camina. 3. La molestia generalmente se siente en la parte superior del tero y se extiende hacia la zona inferior del abdomen y [employer] cintura. 4. El mdico podr examinarla para determinar si el trabajo de parto es verdadero. El examen mostrar si el cuello del tero se est dilatando y [place]. LO QUE DEBE RECORDAR  Contine haciendo los ejercicios habituales y siga otras indicaciones que el mdico le d.  Tome todos los medicamentos como le indic el mdico.  Oceanographer a las visitas prenatales regulares.  Coma y beba con moderacin si cree que est en trabajo de parto.  Si las contracciones de [employer] provocan incomodidad:  Cambie de posicin: si est acostada o descansando, camine; si est caminando, descanse.  Sintese y descanse en una baera con agua tibia.  Beba 2 o 3vasos de [place]. La deshidratacin puede provocar contracciones.  Respire lenta y profundamente varias veces por hora. CUNDO DEBO BUSCAR ASISTENCIA MDICA INMEDIATA? Solicite atencin mdica de inmediato si:  Las contracciones se intensifican, se hacen ms regulares y Arboriculturist s.  Tiene una prdida de lquido por la vagina.  Tiene fiebre.  Elimina mucosidad manchada con [place].  Tiene una hemorragia vaginal abundante.  Tiene dolor abdominal permanente.  Tiene un dolor en la zona lumbar que nunca tuvo antes.  Siente que la cabeza del beb empuja hacia abajo y ejerce presin en la zona plvica.  El beb no se mueve Dentist.   Esta informacin no tiene como fin  reemplazar el consejo del mdico. Asegrese de hacerle al mdico cualquier pregunta que tenga.   Document Released: 10/05/2004 Document Revised: 12/31/2012 Elsevier Interactive Patient Education 2016 [employer].   Evaluacin de los movimientos fetales  (Fetal Movement  Counts) Nombre del paciente: __________________________________________________ Deanna Harrison estimada: ____________________ [name] de los movimientos fetales es muy recomendable en los embarazos de alto riesgo, pero tambin es una buena idea que lo hagan todas las [place]. El Firefighter que comience a contarlos a las 28 semanas de [place]. Los movimientos fetales suelen aumentar:   Despus de Animator.  Despus de la actividad fsica.  Despus de comer o beber [employer] o fro.  En reposo. Preste atencin cuando sienta que el beb est ms activo. Esto le ayudar a notar un patrn de ciclos de vigilia y sueo de su beb y cules son los factores que contribuyen a un aumento de los movimientos fetales. Es importante llevar a cabo un recuento de movimientos fetales, al mismo tiempo cada da, cuando el beb normalmente est ms activo.  CMO CONTAR LOS MOVIMIENTOS FETALES 5. Busque un lugar tranquilo y cmodo para sentarse o recostarse sobre el lado izquierdo. Al recostarse sobre su lado izquierdo, le proporciona una mejor circulacin de [place] y oxgeno al beb. 6. Anote el da y la hora en una hoja de papel o en un diario. 7. Comience contando las pataditas, revoloteos, chasquidos, vueltas o pinchazos en un perodo de 2 horas. Debe sentir al menos 10 movimientos en 2 horas. 8. Si no siente 10 movimientos en 2 horas, espere 2  3 horas y cuente de nuevo. Busque cambios en el patrn o si no cuenta lo suficiente en 2 horas. SOLICITE ATENCIN MDICA SI:   Siente menos de 10 pataditas en 2 horas, en dos intentos.  No hay movimientos durante una hora.  El patrn se modifica o le lleva ms tiempo Art gallery manager las 10 pataditas.  Siente que el beb no se mueve como lo hace habitualmente. Fecha: ____________ Movimientos: ____________ Stevan Born inicio: ____________ Stevan Born finalizacin: ____________  Franco Nones: ____________ Movimientos: ____________ Stevan Born inicio:  ____________ Stevan Born finalizacin: ____________  Franco Nones: ____________ Movimientos: ____________ Stevan Born inicio: ____________ Stevan Born finalizacin: ____________  Franco Nones: ____________ Movimientos: ____________ Stevan Born inicio: ____________ Stevan Born finalizacin: ____________  Franco Nones: ____________ Movimientos: ____________ Stevan Born inicio: ____________ Mammie Russian de finalizacin: ____________  Franco Nones: ____________ Movimientos: ____________ Mammie Russian de inicio: ____________ Mammie Russian de finalizacin: ____________  Franco Nones: ____________ Movimientos: ____________ Mammie Russian de inicio: ____________ Mammie Russian de finalizacin: ____________  Franco Nones: ____________ Movimientos: ____________ Mammie Russian de inicio: ____________ Mammie Russian de finalizacin: ____________  Franco Nones: ____________ Movimientos: ____________ Mammie Russian de inicio: ____________ Mammie Russian de finalizacin: ____________  Franco Nones: ____________ Movimientos: ____________ Mammie Russian de inicio: ____________ Mammie Russian de finalizacin: ____________  Franco Nones: ____________ Movimientos: ____________ Mammie Russian de inicio: ____________ Mammie Russian de finalizacin: ____________  Franco Nones: ____________ Movimientos: ____________ Mammie Russian de inicio: ____________ Mammie Russian de finalizacin: ____________  Franco Nones: ____________ Movimientos: ____________ Mammie Russian de inicio: ____________ Mammie Russian de finalizacin: ____________  Franco Nones: ____________ Movimientos: ____________ Mammie Russian de inicio: ____________ Mammie Russian de finalizacin: ____________  Franco Nones: ____________ Movimientos: ____________ Mammie Russian de inicio: ____________ Mammie Russian de finalizacin: ____________  Franco Nones: ____________ Movimientos: ____________ Mammie Russian de inicio: ____________ Mammie Russian de finalizacin: ____________  Franco Nones: ____________ Movimientos: ____________ Mammie Russian de inicio: ____________ Mammie Russian de finalizacin: ____________  Franco Nones: ____________ Movimientos: ____________ Stevan Born inicio: ____________ Stevan Born finalizacin: ____________  Franco Nones: ____________ Movimientos: ____________ Stevan Born inicio: ____________ Stevan Born finalizacin:  ____________  Franco Nones: ____________ Movimientos: ____________  Hora de inicio: ____________ Mammie RussianHora de finalizacin: ____________  Franco NonesFecha: ____________ Movimientos: ____________ Stevan BornHora de inicio: ____________ Stevan BornHora de finalizacin: ____________  Franco NonesFecha: ____________ Movimientos: ____________ Stevan BornHora de inicio: ____________ Stevan BornHora de finalizacin: ____________  Franco NonesFecha: ____________ Movimientos: ____________ Stevan BornHora de inicio: ____________ Stevan BornHora de finalizacin: ____________  Franco NonesFecha: ____________ Movimientos: ____________ Stevan BornHora de inicio: ____________ Stevan BornHora de finalizacin: ____________  Franco NonesFecha: ____________ Movimientos: ____________ Stevan BornHora de inicio: ____________ Mammie RussianHora de finalizacin: ____________  Franco NonesFecha: ____________ Movimientos: ____________ Mammie RussianHora de inicio: ____________ Mammie RussianHora de finalizacin: ____________  Franco NonesFecha: ____________ Movimientos: ____________ Mammie RussianHora de inicio: ____________ Mammie RussianHora de finalizacin: ____________  Franco NonesFecha: ____________ Movimientos: ____________ Mammie RussianHora de inicio: ____________ Mammie RussianHora de finalizacin: ____________  Franco NonesFecha: ____________ Movimientos: ____________ Mammie RussianHora de inicio: ____________ Mammie RussianHora de finalizacin: ____________  Franco NonesFecha: ____________ Movimientos: ____________ Mammie RussianHora de inicio: ____________ Mammie RussianHora de finalizacin: ____________  Franco NonesFecha: ____________ Movimientos: ____________ Mammie RussianHora de inicio: ____________ Mammie RussianHora de finalizacin: ____________  Franco NonesFecha: ____________ Movimientos: ____________ Mammie RussianHora de inicio: ____________ Mammie RussianHora de finalizacin: ____________  Franco NonesFecha: ____________ Movimientos: ____________ Mammie RussianHora de inicio: ____________ Mammie RussianHora de finalizacin: ____________  Franco NonesFecha: ____________ Movimientos: ____________ Mammie RussianHora de inicio: ____________ Mammie RussianHora de finalizacin: ____________  Franco NonesFecha: ____________ Movimientos: ____________ Mammie RussianHora de inicio: ____________ Mammie RussianHora de finalizacin: ____________  Franco NonesFecha: ____________ Movimientos: ____________ Mammie RussianHora de inicio: ____________ Mammie RussianHora de finalizacin: ____________  Franco NonesFecha: ____________  Movimientos: ____________ Mammie RussianHora de inicio: ____________ Mammie RussianHora de finalizacin: ____________  Franco NonesFecha: ____________ Movimientos: ____________ Mammie RussianHora de inicio: ____________ Mammie RussianHora de finalizacin: ____________  Franco NonesFecha: ____________ Movimientos: ____________ Mammie RussianHora de inicio: ____________ Mammie RussianHora de finalizacin: ____________  Franco NonesFecha: ____________ Movimientos: ____________ Mammie RussianHora de inicio: ____________ Mammie RussianHora de finalizacin: ____________  Franco NonesFecha: ____________ Movimientos: ____________ Mammie RussianHora de inicio: ____________ Mammie RussianHora de finalizacin: ____________  Franco NonesFecha: ____________ Movimientos: ____________ Mammie RussianHora de inicio: ____________ Mammie RussianHora de finalizacin: ____________  Franco NonesFecha: ____________ Movimientos: ____________ Mammie RussianHora de inicio: ____________ Mammie RussianHora de finalizacin: ____________  Franco NonesFecha: ____________ Movimientos: ____________ Mammie RussianHora de inicio: ____________ Mammie RussianHora de finalizacin: ____________  Franco NonesFecha: ____________ Movimientos: ____________ Mammie RussianHora de inicio: ____________ Mammie RussianHora de finalizacin: ____________  Franco NonesFecha: ____________ Movimientos: ____________ Mammie RussianHora de inicio: ____________ Mammie RussianHora de finalizacin: ____________  Franco NonesFecha: ____________ Movimientos: ____________ Mammie RussianHora de inicio: ____________ Mammie RussianHora de finalizacin: ____________  Franco NonesFecha: ____________ Movimientos: ____________ Mammie RussianHora de inicio: ____________ Mammie RussianHora de finalizacin: ____________  Franco NonesFecha: ____________ Movimientos: ____________ Mammie RussianHora de inicio: ____________ Mammie RussianHora de finalizacin: ____________  Franco NonesFecha: ____________ Movimientos: ____________ Mammie RussianHora de inicio: ____________ Mammie RussianHora de finalizacin: ____________  Franco NonesFecha: ____________ Movimientos: ____________ Mammie RussianHora de inicio: ____________ Mammie RussianHora de finalizacin: ____________  Franco NonesFecha: ____________ Movimientos: ____________ Mammie RussianHora de inicio: ____________ Mammie RussianHora de finalizacin: ____________  Franco NonesFecha: ____________ Movimientos: ____________ Mammie RussianHora de inicio: ____________ Mammie RussianHora de finalizacin: ____________  Franco NonesFecha: ____________ Movimientos: ____________ Mammie RussianHora de inicio:  ____________ Mammie RussianHora de finalizacin: ____________  Franco NonesFecha: ____________ Movimientos: ____________ Mammie RussianHora de inicio: ____________ Mammie RussianHora de finalizacin: ____________  Franco NonesFecha: ____________ Movimientos: ____________ Mammie RussianHora de inicio: ____________ Mammie RussianHora de finalizacin: ____________    Esta informacin no tiene como fin reemplazar el consejo del mdico. Asegrese de hacerle al mdico cualquier pregunta que tenga.   Document Released: 04/04/2007 Document Revised: 12/13/2011 Elsevier Interactive Patient Education [employer]2016 Elsevier Inc.

## 2014-11-23 ENCOUNTER — Encounter (HOSPITAL_COMMUNITY): Payer: Self-pay | Admitting: *Deleted

## 2014-11-23 ENCOUNTER — Inpatient Hospital Stay (HOSPITAL_COMMUNITY)
Admission: AD | Admit: 2014-11-23 | Discharge: 2014-11-24 | DRG: 775 | Disposition: A | Discharge status: Home / Self Care | Payer: Medicaid Other | Source: Ambulatory Visit | Attending: Obstetrics & Gynecology | Admitting: Obstetrics & Gynecology

## 2014-11-23 DIAGNOSIS — O9952 Diseases of the respiratory system complicating childbirth: Secondary | ICD-10-CM | POA: Diagnosis present

## 2014-11-23 DIAGNOSIS — O99214 Obesity complicating childbirth: Secondary | ICD-10-CM | POA: Diagnosis present

## 2014-11-23 DIAGNOSIS — Z3A38 38 weeks gestation of pregnancy: Secondary | ICD-10-CM

## 2014-11-23 DIAGNOSIS — Z6838 Body mass index (BMI) 38.0-38.9, adult: Secondary | ICD-10-CM

## 2014-11-23 DIAGNOSIS — IMO0001 Reserved for inherently not codable concepts without codable children: Secondary | ICD-10-CM

## 2014-11-23 DIAGNOSIS — E669 Obesity, unspecified: Secondary | ICD-10-CM | POA: Diagnosis present

## 2014-11-23 LAB — CBC
HEMATOCRIT: 31.6 % — AB (ref 36.0–46.0)
HEMOGLOBIN: 9.9 g/dL — AB (ref 12.0–15.0)
MCH: 21.9 pg — AB (ref 26.0–34.0)
MCHC: 31.3 g/dL (ref 30.0–36.0)
MCV: 69.8 fL — AB (ref 78.0–100.0)
PLATELETS: 339 10*3/uL (ref 150–400)
RBC: 4.53 MIL/uL (ref 3.87–5.11)
RDW: 17.2 % — ABNORMAL HIGH (ref 11.5–15.5)
WBC: 12.4 10*3/uL — AB (ref 4.0–10.5)

## 2014-11-23 LAB — TYPE AND SCREEN
ABO/RH(D): A POS
ANTIBODY SCREEN: NEGATIVE

## 2014-11-23 LAB — ABO/RH: ABO/RH(D): A POS

## 2014-11-23 LAB — RPR: RPR Ser Ql: NONREACTIVE

## 2014-11-23 MED ORDER — SENNOSIDES-DOCUSATE SODIUM 8.6-50 MG PO TABS
2.0000 | ORAL_TABLET | ORAL | Status: DC
  Administered 2014-11-23: 2 via ORAL
  Filled 2014-11-23: qty 2

## 2014-11-23 MED ORDER — ACETAMINOPHEN 325 MG PO TABS: 650.0000 mg | ORAL_TABLET | ORAL | Status: DC | PRN

## 2014-11-23 MED ORDER — IBUPROFEN 600 MG PO TABS
600.0000 mg | ORAL_TABLET | Freq: Four times a day (QID) | ORAL | Status: DC
  Administered 2014-11-23 (×4): 600 mg via ORAL
  Filled 2014-11-23 (×5): qty 1

## 2014-11-23 MED ORDER — OXYTOCIN BOLUS FROM INFUSION
500.0000 mL | INTRAVENOUS | Status: DC
Start: 2014-11-23 — End: 2014-11-23
  Administered 2014-11-23: 500 mL via INTRAVENOUS

## 2014-11-23 MED ORDER — OXYTOCIN 40 UNITS IN LACTATED RINGERS INFUSION - SIMPLE MED
62.5000 mL/h | INTRAVENOUS | Status: DC
  Filled 2014-11-23: qty 1000

## 2014-11-23 MED ORDER — OXYCODONE-ACETAMINOPHEN 5-325 MG PO TABS: 2.0000 | ORAL_TABLET | ORAL | Status: DC | PRN

## 2014-11-23 MED ORDER — ONDANSETRON HCL 4 MG/2ML IJ SOLN: 4.0000 mg | Freq: Four times a day (QID) | INTRAMUSCULAR | Status: DC | PRN

## 2014-11-23 MED ORDER — LACTATED RINGERS IV SOLN: 500.0000 mL | INTRAVENOUS | Status: DC | PRN

## 2014-11-23 MED ORDER — LACTATED RINGERS IV SOLN
INTRAVENOUS | Status: DC
  Administered 2014-11-23: 03:00:00 via INTRAVENOUS

## 2014-11-23 MED ORDER — ONDANSETRON HCL 4 MG PO TABS: 4.0000 mg | ORAL_TABLET | ORAL | Status: DC | PRN

## 2014-11-23 MED ORDER — BENZOCAINE-MENTHOL 20-0.5 % EX AERO
1.0000 "application " | INHALATION_SPRAY | CUTANEOUS | Status: DC | PRN
  Administered 2014-11-23: 1 via TOPICAL
  Filled 2014-11-23: qty 56

## 2014-11-23 MED ORDER — LIDOCAINE HCL (PF) 1 % IJ SOLN
30.0000 mL | INTRAMUSCULAR | Status: DC | PRN
  Administered 2014-11-23: 30 mL via SUBCUTANEOUS
  Filled 2014-11-23: qty 30

## 2014-11-23 MED ORDER — ZOLPIDEM TARTRATE 5 MG PO TABS: 5.0000 mg | ORAL_TABLET | Freq: Every evening | ORAL | Status: DC | PRN

## 2014-11-23 MED ORDER — TETANUS-DIPHTH-ACELL PERTUSSIS 5-2.5-18.5 LF-MCG/0.5 IM SUSP: 0.5000 mL | Freq: Once | INTRAMUSCULAR | Status: DC

## 2014-11-23 MED ORDER — LANOLIN HYDROUS EX OINT: TOPICAL_OINTMENT | CUTANEOUS | Status: DC | PRN

## 2014-11-23 MED ORDER — ONDANSETRON HCL 4 MG/2ML IJ SOLN: 4.0000 mg | INTRAMUSCULAR | Status: DC | PRN

## 2014-11-23 MED ORDER — OXYCODONE-ACETAMINOPHEN 5-325 MG PO TABS: 1.0000 | ORAL_TABLET | ORAL | Status: DC | PRN

## 2014-11-23 MED ORDER — PRENATAL MULTIVITAMIN CH
1.0000 | ORAL_TABLET | Freq: Every day | ORAL | Status: DC
  Administered 2014-11-23: 1 via ORAL
  Filled 2014-11-23: qty 1

## 2014-11-23 MED ORDER — CITRIC ACID-SODIUM CITRATE 334-500 MG/5ML PO SOLN
30.0000 mL | ORAL | Status: DC | PRN
Start: 2014-11-23 — End: 2014-11-23

## 2014-11-23 MED ORDER — WITCH HAZEL-GLYCERIN EX PADS: 1.0000 "application " | MEDICATED_PAD | CUTANEOUS | Status: DC | PRN

## 2014-11-23 MED ORDER — FENTANYL CITRATE (PF) 100 MCG/2ML IJ SOLN: 50.0000 ug | INTRAMUSCULAR | Status: DC | PRN

## 2014-11-23 MED ORDER — DIPHENHYDRAMINE HCL 25 MG PO CAPS: 25.0000 mg | ORAL_CAPSULE | Freq: Four times a day (QID) | ORAL | Status: DC | PRN

## 2014-11-23 MED ORDER — FLEET ENEMA 7-19 GM/118ML RE ENEM: 1.0000 | ENEMA | RECTAL | Status: DC | PRN

## 2014-11-23 MED ORDER — DIBUCAINE 1 % RE OINT: 1.0000 "application " | TOPICAL_OINTMENT | RECTAL | Status: DC | PRN

## 2014-11-23 MED ORDER — SIMETHICONE 80 MG PO CHEW: 80.0000 mg | CHEWABLE_TABLET | ORAL | Status: DC | PRN

## 2014-11-23 NOTE — H&P (Signed)
LABOR ADMISSION HISTORY AND PHYSICAL  Deanna Harrison is a 24 y.o. female G3P1011 with IUP at 756w0d by LMP presenting for SOL. She reports +FM, + contractions, + LOF @ 0200, no VB, no blurry vision, headaches or peripheral edema, and RUQ pain.  She plans on breast and bottle feeding. She is undecided on birth control.  Dating: By LMP --->  Estimated Date of Delivery: 12/07/14  Sono:    @[redacted]w[redacted]d , CWD, normal anatomy with limited views of spine, cephalic presentation, longitudinal lie, 299 g, 64% EFW   Prenatal History/Complications: Hx of gestational diabetes (diet controlled) of last pregnancy.   Past Medical History: Past Medical History  Diagnosis Date  . Asthma   . GERD (gastroesophageal reflux disease)   . Gestational diabetes     Past Surgical History: Past Surgical History  Procedure Laterality Date  . No past surgeries      Obstetrical History: OB History    Gravida Para Term Preterm AB TAB SAB Ectopic Multiple Living   3 1 1  1  1   1       Social History: Social History   Social History  . Marital Status: Single    Spouse Name: N/A  . Number of Children: N/A  . Years of Education: N/A   Social History Main Topics  . Smoking status: Never Smoker   . Smokeless tobacco: Never Used  . Alcohol Use: No  . Drug Use: No  . Sexual Activity: Yes   Other Topics Concern  . None   Social History Narrative    Family History: History reviewed. No pertinent family history.  Allergies: No Known Allergies  Prescriptions prior to admission  Medication Sig Dispense Refill Last Dose  . Prenatal Vit-Fe Fumarate-FA (PRENATAL MULTIVITAMIN) TABS tablet Take 1 tablet by mouth daily at 12 noon.   11/22/2014 at Unknown time  . meclizine (ANTIVERT) 50 MG tablet Take 1 tablet (50 mg total) by mouth 3 (three) times daily as needed. 30 tablet 0 More than a month at Unknown time  . sulfamethoxazole-trimethoprim (SEPTRA DS) 800-160 MG per tablet Take 1 tablet by mouth  every 12 (twelve) hours. 20 tablet 0 More than a month at Unknown time     Review of Systems   All systems reviewed and negative except as stated in HPI  BP 128/72 mmHg  Pulse 104  Temp(Src) 98.9 F (37.2 C) (Oral)  Resp 18  LMP 03/02/2014 General appearance: alert, cooperative, appears stated age, moderate distress and mildly obese Lungs: clear to auscultation bilaterally Heart: regular rate and rhythm Abdomen: soft, non-tender Pelvic: Cervix dilated to 8 cm.  Extremities: Homans sign is negative, no sign of DVT, edema Presentation: cephalic Fetal monitoringBaseline: 130 bpm, Variability: Good {> 6 bpm), Accelerations: Reactive and Decelerations: Absent Uterine activityFrequency: Every 2 minutes Dilation: 8 Effacement (%): 100 Station: 0 Exam by:: a.[email or web address] rn   Prenatal labs: ABO, Rh:  A pos (06/01/14) Antibody:  Neg (06/01/14) Rubella: Immune (06/01/14) RPR:  Negative (09/17/14)  HBsAg:  Negative (06/01/14)  HIV:  Negative (06/01/14) GBS:  Negative (11/14/14) 1 hr Glucola 116 (early), 97 Genetic screening  Declined first trimester screen and Quad. NT normal.  Anatomy US nml  Prenatal Transfer Tool  Maternal Diabetes: No Genetic Screening: Declined Maternal Ultrasounds/Referrals: Normal Fetal Ultrasounds or other Referrals:  None Maternal Substance Abuse:  No Significant Maternal Medications:  None Significant Maternal Lab Results: Lab values include: Group B Strep negative  No results found for this or any previous visit (from  the past 24 hour(s)).  Patient Active Problem List   Diagnosis Date Noted  . Active labor 11/23/2014  . Encounter for fetal anatomic survey   . [redacted] weeks gestation of pregnancy   . [redacted] weeks gestation of pregnancy   . Encounter for (NT) nuchal translucency scan     Assessment: Deanna Harrison is a 24 y.o. G3P1011 at [redacted]w[redacted]d here for SOL.   #Labor: Expectant management #Pain: No meds.  #FWB: Cat I #ID:  GBS neg #MOF: Breast and  Bottle #MOC: Undecided #Circ:  No  Dani Gobble, MD [facility], PGY-1

## 2014-11-23 NOTE — Lactation Note (Signed)
This note was copied from the chart of Deanna Vernona RiegerLaura Harrison. Lactation Consultation Note  P2, Ex BF for 6 months. Mother speaks and understands english well. Baby latched in cross cradle upon entering on L side. A few swallows observed. Mother holding tissue back from baby's nose.  Provided education about how baby's breathe when breastfeeding. Reviewed hand expression on R side and mother can easily express drops of colostrum. Discussed cluster feeding and basics.  Pacifier use not recommended at this time.  Mom encouraged to feed baby 8-12 times/24 hours and with feeding cues.  Mom made aware of O/P services, breastfeeding support groups, community resources, and our phone # for post-discharge questions in Spanish.       Patient Name: Deanna Harrison [id number]'s Date: 11/23/2014 Reason for consult: Initial assessment   Maternal Data Does the patient have breastfeeding experience prior to this delivery?: Yes  Feeding Feeding Type: Breast Fed  LATCH Score/Interventions Latch: Grasps breast easily, tongue down, lips flanged, rhythmical sucking. (latched upon entering)  Audible Swallowing: A few with stimulation  Type of Nipple: Everted at rest and after stimulation  Comfort (Breast/Nipple): Soft / non-tender     Hold (Positioning): Assistance needed to correctly position infant at breast and maintain latch.  LATCH Score: 8  Lactation Tools Discussed/Used     Consult Status Consult Status: Follow-up Date: 11/24/14 Follow-up type: In-patient    Deanna Harrison, Deanna [facility] 11/23/2014, 8:26 AM

## 2014-11-24 MED ORDER — IBUPROFEN 600 MG PO TABS: 600.0000 mg | ORAL_TABLET | Freq: Four times a day (QID) | ORAL | Status: DC

## 2014-11-24 NOTE — Lactation Note (Signed)
This note was copied from the chart of Deanna Harrison. Lactation Consultation Note  Patient Name: Deanna Harrison [id number]'s Date: 11/24/2014 Reason for consult: Follow-up assessment;Other (Comment) (5% weight loss ) Baby 31 hours old , breast feeding range 15-50 mins, latch scores - 6-8-8-8-, Supplemented x2 with formula 15 ml , voids and stools adequate. Bili check at 30 hours - 6.4. Per mom left nipple sore - LC assessed with moms permission - LC  Noted a positional strip outer  Edge no cracking or redness noted. LC instructed mom on the use comfort gels, and hand pump.  Sore nipple and engorgement prevention and tx reviewed. LC recommended if sore nipple hasn't improved by 4 day to call the [facility] office. Mother informed of post-discharge support and given phone number to the lactation department, including services for phone call assistance; out-patient appointments; and breastfeeding support group. List of other breastfeeding resources in the community given in the handout. Encouraged mother to call for problems or concerns related to breastfeeding.  Maternal Data Has patient been taught Hand Expression?: Yes  Feeding ( this feeding prior to consult )  Feeding Type: Breast Fed Length of feed: 15 min  LATCH Score/Interventions                Intervention(s): Breastfeeding basics reviewed     Lactation Tools Discussed/Used Tools: Comfort gels;Pump Breast pump type: Manual WIC Program: [place] ) Pump Review: Setup, frequency, and cleaning;Milk Storage Initiated by:: MAI  Date initiated:: 11/24/14   Consult Status Consult Status: Complete Date: 11/24/14    Kathrin Greathouseorio, Jaramie Bastos Ann 11/24/2014, 10:58 AM

## 2014-11-24 NOTE — Discharge Summary (Signed)
OB Discharge Summary  Patient Name: Deanna Harrison DOB: 07/03/1990 MRN: [id number]  Date of admission: 11/23/2014 Delivering MD: Casey BurkittFITZGERALD, HILLARY MOEN   Date of discharge: 11/24/2014  Admitting diagnosis: 40 WEEKS CTX Intrauterine pregnancy: 5766w0d     Secondary diagnosis:Principal Problem:   NSVD (normal spontaneous vaginal delivery) Active Problems:   Active labor   Second-degree perineal laceration, with delivery  Additional problems: Asthma, history of gDM of previous pregnancy    Discharge diagnosis: Term Pregnancy Delivered                                                                     Post partum procedures:None  Augmentation: AROM   Complications: None  Hospital course:  Onset of Labor With Vaginal Delivery     24 y.o. yo [id number] at 5366w0d was admitted in Active Laboron 11/23/2014. Patient had an uncomplicated labor course as follows:  Membrane Rupture Time/Date: 2:43 AM ,11/23/2014   Intrapartum Procedures: Episiotomy: None [1]                                         Lacerations:  2nd degree [3];Perineal [11]  Patient had a delivery of a Viable infant. 11/23/2014  Information for the patient's newborn:  Deanna Harrison [[id number]][[id number]]  Delivery Method: Vaginal, Spontaneous Delivery (Filed from Delivery Summary)     Pateint had an uncomplicated postpartum course.  She is ambulating, tolerating a regular diet, passing flatus, and urinating well. Patient is discharged home in stable condition on No discharge date for patient encounter.Marland Kitchen.    Physical exam  Filed Vitals:   11/23/14 0657 11/23/14 1100 11/23/14 1824 11/24/14 0613  BP: 105/57 112/57 104/42 117/69  Pulse: 90 88 85 88  Temp: 98.2 F (36.8 C) 98.3 F (36.8 C) 98.2 F (36.8 C) 98.3 F (36.8 C)  TempSrc: Axillary Oral Oral Oral  Resp: 18 18 18 20   Height:      Weight:      SpO2: 100% 100%     General: alert, cooperative and no distress Lochia: appropriate Uterine  Fundus: firm Incision: N/A DVT Evaluation: No evidence of DVT seen on physical exam. Negative Homan's sign. No significant calf/ankle edema. Labs: Lab Results  Component Value Date   WBC 12.4* 11/23/2014   HGB 9.9* 11/23/2014   HCT 31.6* 11/23/2014   MCV 69.8* 11/23/2014   PLT 339 11/23/2014   CMP Latest Ref Rng 09/21/2013  Glucose 70 - 99 mg/dL 130(Q130(H)  BUN 6 - 23 mg/dL 13  Creatinine 6.570.50 - 8.461.10 mg/dL 9.620.67  Sodium 952137 - 841147 mEq/L 140  Potassium 3.7 - 5.3 mEq/L 4.5  Chloride 96 - 112 mEq/L 104  CO2 19 - 32 mEq/L 24  Calcium 8.4 - 10.5 mg/dL 9.3  Total Protein 6.0 - 8.3 g/dL 3.2(G8.7(H)  Total Bilirubin 0.3 - 1.2 mg/dL <4.0(N<0.2(L)  Alkaline Phos 39 - 117 U/L 117  AST 0 - 37 U/L 21  ALT 0 - 35 U/L 17    Discharge instruction: per After Visit Summary and "Baby and Me Booklet".  After Visit Meds:    Medication List    STOP taking these  medications        meclizine 50 MG tablet  Commonly known as:  ANTIVERT     sulfamethoxazole-trimethoprim 800-160 MG tablet  Commonly known as:  SEPTRA DS      TAKE these medications        ibuprofen 600 MG tablet  Commonly known as:  ADVIL,MOTRIN  Take 1 tablet (600 mg total) by mouth every 6 (six) hours.     prenatal multivitamin Tabs tablet  Take 1 tablet by mouth daily at 12 noon.        Diet: routine diet  Activity: Advance as tolerated. Pelvic rest for 6 weeks.   Outpatient follow up:6 weeks Follow up Appt:No future appointments. Follow up visit: in 6 weeks Postpartum contraception: Nexplanon  Newborn Data: Live born female  Birth Weight: 7 lb 11.6 oz (3505 g) APGAR: 8, 9  Baby Feeding: Bottle and Breast Disposition:home with mother   11/24/2014 Deanna Flake, MD  [facility], PGY-1  OB fellow attestation I have seen and examined this patient and agree with above documentation in the resident's note.   Deanna Harrison is a 24 y.o. [id number] s/p NSVD.   Pain is well controlled.  Plan  for birth control is Nexplanon.  Method of Feeding: Breast/Formula  PE:  BP 117/69 mmHg  Pulse 88  Temp(Src) 98.3 F (36.8 C) (Oral)  Resp 20  Ht 5' 1.5" (1.562 m)  Wt 206 lb (93.441 kg)  BMI 38.30 kg/m2  SpO2 100%  LMP 03/02/2014  Breastfeeding? Unknown Gen: well appearing Heart: reg rate Lungs: normal WOB Fundus firm Ext: soft, no pain, no edema  Recent Labs  11/23/14 0255  HGB 9.9*  HCT 31.6*   Plan: discharge today - postpartum care discussed - f/u clinic in 6 weeks for postpartum visit  Deanna Flake, MD 10:02 AM

## 2014-11-24 NOTE — Discharge Instructions (Signed)

## 2014-12-07 NOTE — H&P (Signed)
Montez Morita, CNM Certified Nurse Midwife [facility] H&P 11/23/2014 3:07 AM    Expand All Collapse All   LABOR ADMISSION HISTORY AND PHYSICAL  Deanna Harrison is a 24 y.o. female G3P1011 with IUP at [redacted]w[redacted]d by LMP presenting for SOL. She reports +FM, + contractions, + LOF @ 0200, no VB, no blurry vision, headaches or peripheral edema, and RUQ pain. She plans on breast and bottle feeding. She is undecided on birth control.  Dating: By LMP ---> Estimated Date of Delivery: 12/07/14  Sono:   , CWD, normal anatomy with limited views of spine, cephalic presentation, longitudinal lie, 299 g, 64% EFW   Prenatal History/Complications: Hx of gestational diabetes (diet controlled) of last pregnancy.   Past Medical History: Past Medical History  Diagnosis Date  . Asthma   . GERD (gastroesophageal reflux disease)   . Gestational diabetes     Past Surgical History: Past Surgical History  Procedure Laterality Date  . No past surgeries      Obstetrical History: OB History    Gravida Para Term Preterm AB TAB SAB Ectopic Multiple Living   Social History: Social History   Social History  . Marital Status: Single    Spouse Name: N/A  . Number of Children: N/A  . Years of Education: N/A   Social History Main Topics  . Smoking status: Never Smoker   . Smokeless tobacco: Never Used  . Alcohol Use: No  . Drug Use: No  . Sexual Activity: Yes   Other Topics Concern  . None   Social History Narrative    Family History: History reviewed. No pertinent family history.  Allergies: No Known Allergies  Prescriptions prior to admission  Medication Sig Dispense Refill Last Dose  . Prenatal Vit-Fe Fumarate-FA (PRENATAL MULTIVITAMIN) TABS tablet Take 1 tablet by mouth daily at 12 noon.   11/22/2014 at Unknown time  .  meclizine (ANTIVERT) 50 MG tablet Take 1 tablet (50 mg total) by mouth 3 (three) times daily as needed. 30 tablet 0 More than a month at Unknown time  . sulfamethoxazole-trimethoprim (SEPTRA DS) 800-160 MG per tablet Take 1 tablet by mouth every 12 (twelve) hours. 20 tablet 0 More than a month at Unknown time     Review of Systems   All systems reviewed and negative except as stated in HPI  BP 128/72 mmHg  Pulse 104  Temp(Src) 98.9 F (37.2 C) (Oral)  Resp 18  LMP 03/02/2014 General appearance: alert, cooperative, appears stated age, moderate distress and mildly obese Lungs: clear to auscultation bilaterally Heart: regular rate and rhythm Abdomen: soft, non-tender Pelvic: Cervix dilated to 8 cm.  Extremities: Homans sign is negative, no sign of DVT, edema Presentation: cephalic Fetal monitoringBaseline: 130 bpm, Variability: Good {> 6 bpm), Accelerations: Reactive and Decelerations: Absent Uterine activityFrequency: Every 2 minutes Dilation: 8 Effacement (%): 100 Station: 0 Exam by:: a.thacker rn   Prenatal labs: ABO, Rh:  A pos (06/01/14) Antibody:  Neg (06/01/14) Rubella: Immune (06/01/14) RPR:  Negative (09/17/14)  HBsAg:  Negative (06/01/14)  HIV:  Negative (06/01/14) GBS:  Negative (11/14/14) 1 hr Glucola 116 (early), 97 Genetic screening Declined first trimester screen and Quad. NT normal.  Anatomy US nml  Prenatal Transfer Tool  Maternal Diabetes: No Genetic Screening: Declined Maternal Ultrasounds/Referrals: Normal Fetal Ultrasounds or other Referrals: None Maternal Substance Abuse: No Significant Maternal Medications: None Significant Maternal Lab Results: Lab  values include: Group B Strep negative  No results found for this or any previous visit (from the past 24 hour(s)).  Patient Active Problem List   Diagnosis Date Noted  . Active labor 11/23/2014  . Encounter for fetal anatomic survey   . [redacted] weeks gestation of  pregnancy   . [redacted] weeks gestation of pregnancy   . Encounter for (NT) nuchal translucency scan     Assessment: Bryna ColanderLaura [name] is a 24 y.o. G3P1011 at 3370w0d here for SOL.   #Labor: Expectant management #Pain:No meds.  #FWB:Cat I #ID: GBS [id number] #MOF: Breast and Bottle #MOC: Undecided #Circ: No  Dani GobbleHillary Fitzgerald, MD [facility], PGY-1           Revision History     Date/Time User Provider Type Action   12/07/2014 8:48 AM Montez MoritaMarie D Chrissa Meetze, CNM Certified Nurse Midwife Sign   11/23/2014 3:58 AM Hillary Percell BostonMoen Fitzgerald, MD Resident Sign   View Details Report

## 2015-09-29 IMAGING — US US MFM FETAL NUCHAL TRANSLUCENCY
1 series · 13 of 28 positions shown · non-contrast
Comparison: none

[Series 1: us mfm fetal nuchal translucency · 13 of 31 slices shown]
[im 2/31]
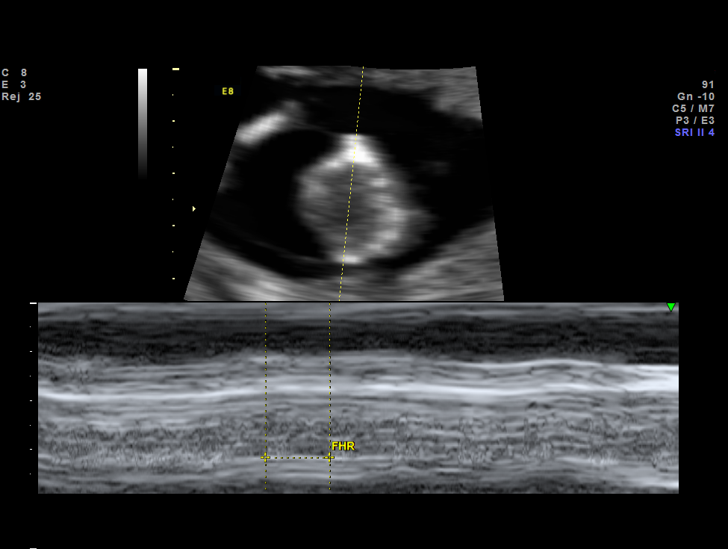
[im 4/31]
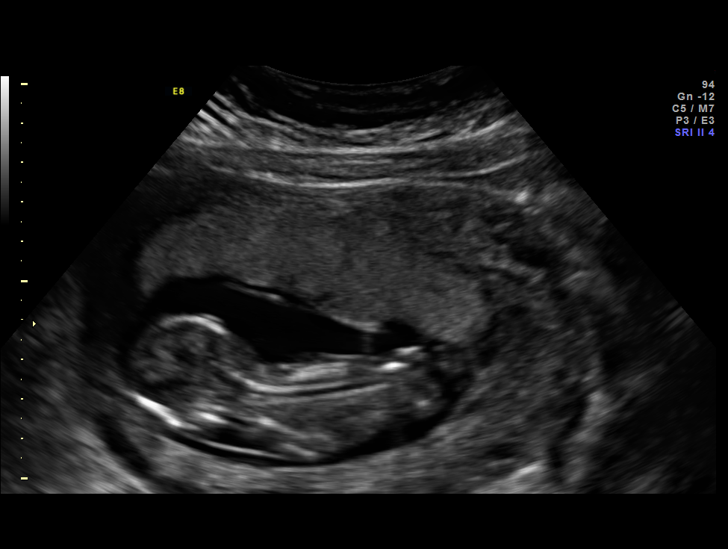
[im 6/31]
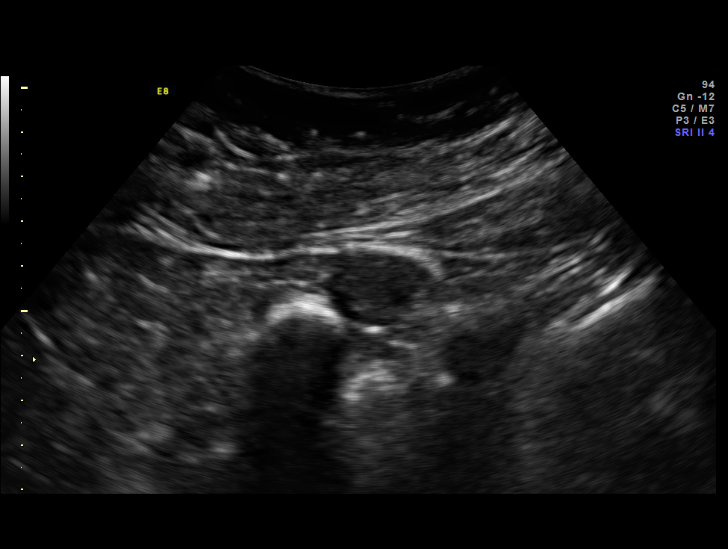
[im 8/31]
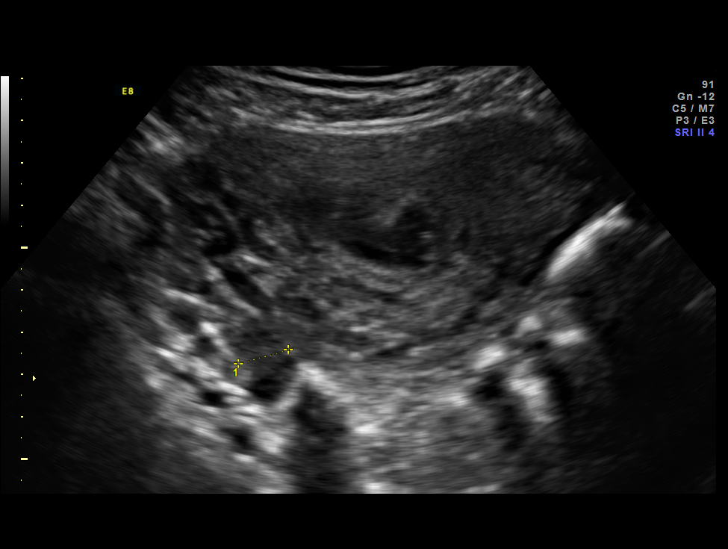
[im 11/31]
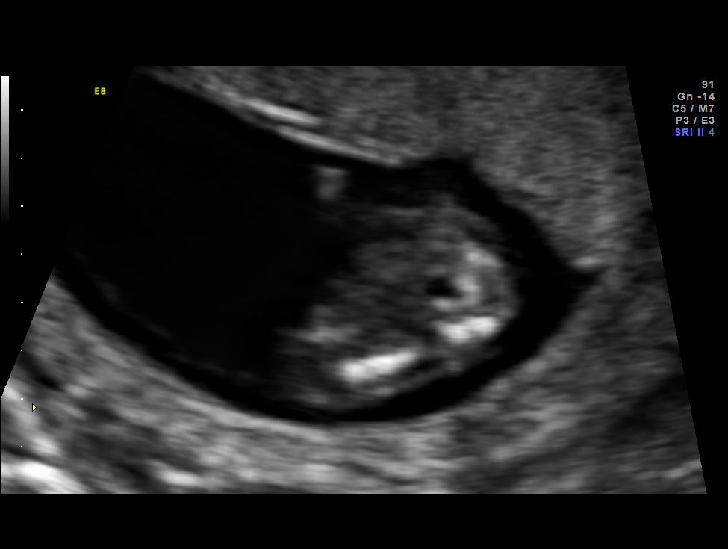
[im 13/31]
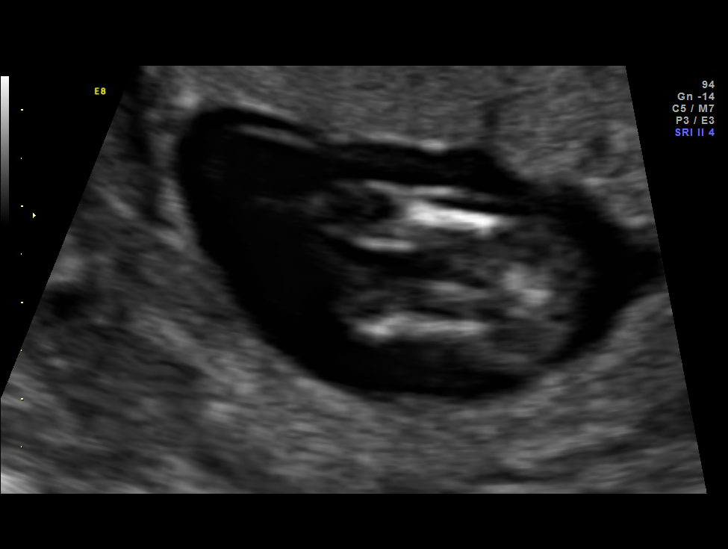
[im 16/31]
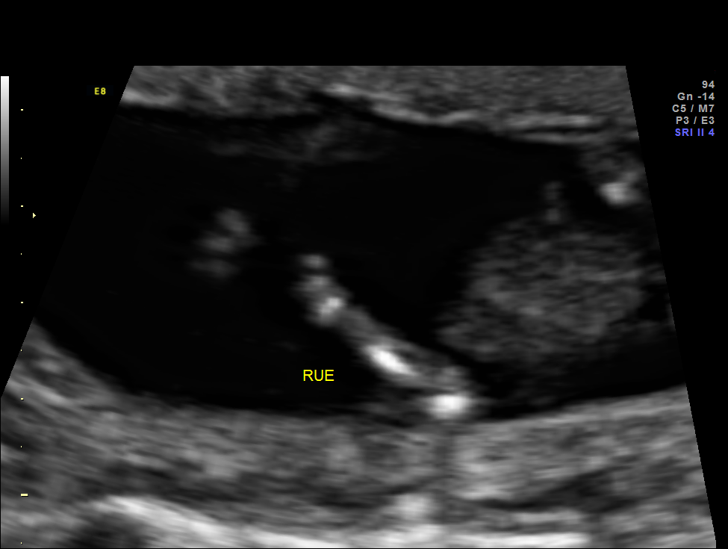
[im 18/31]
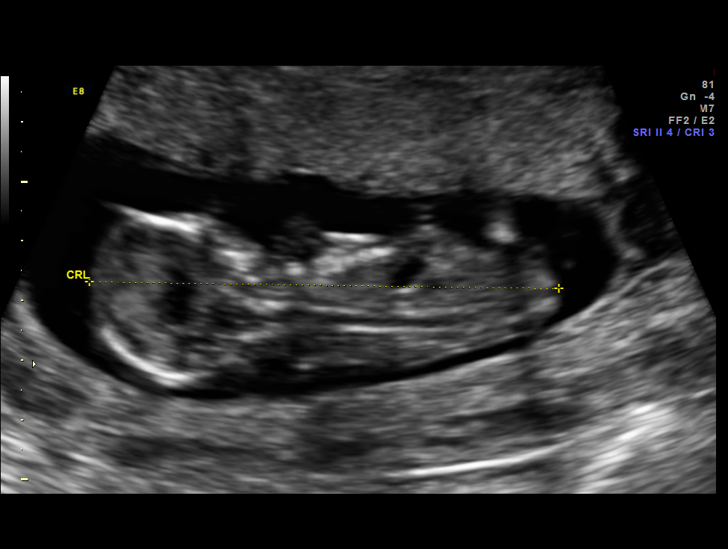
[im 21/31]
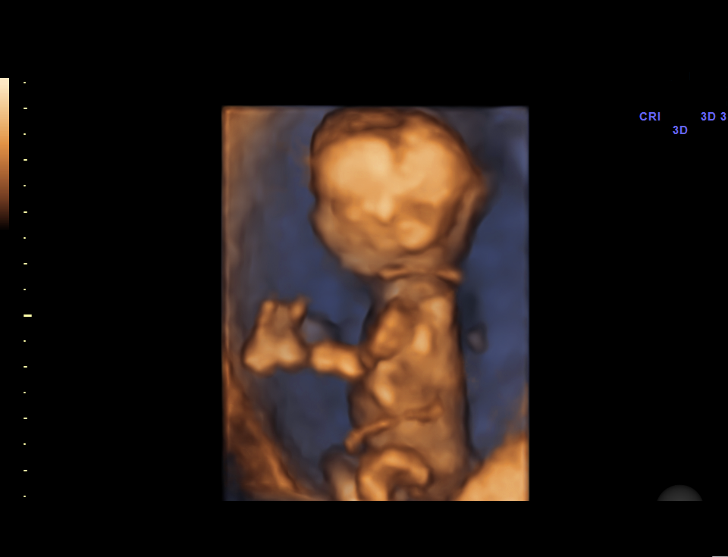
[im 23/31]
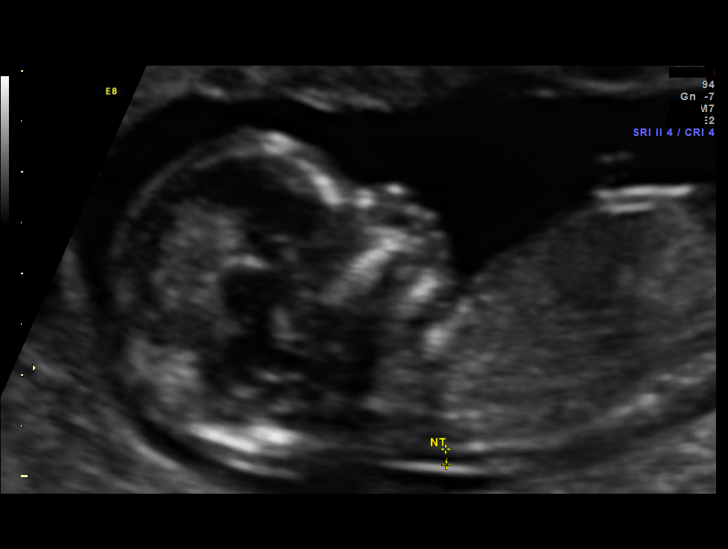
[im 25/31]
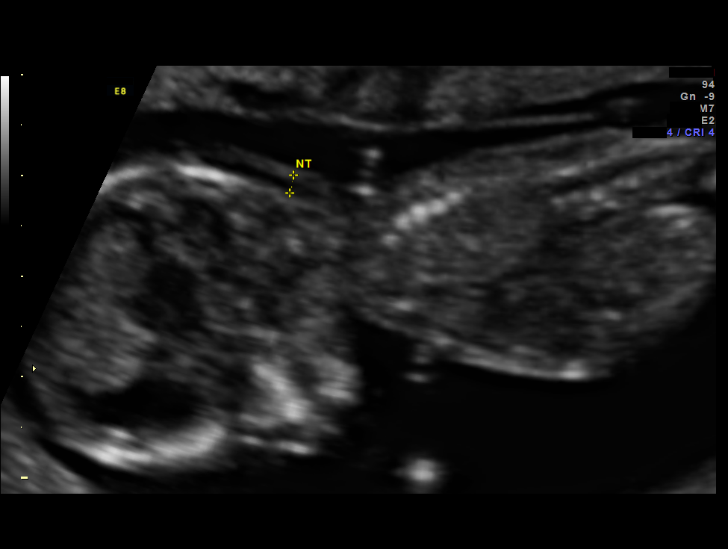
[im 27/31]
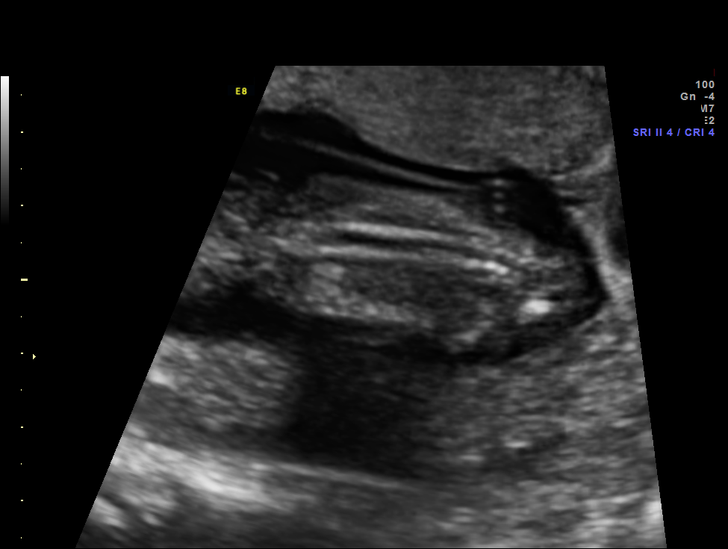
[im 29/31]
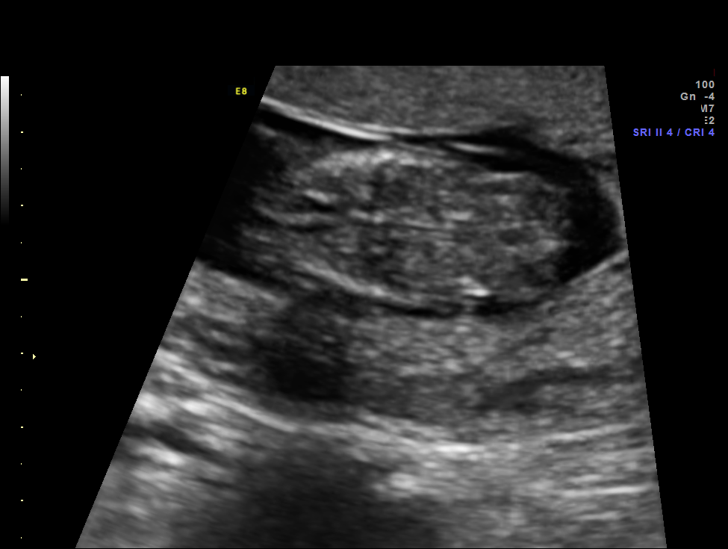

[13 of 28 positions shown; findings below may reference images not displayed]

OBSTETRICS REPORT
(Signed Final 06/03/2014 [DATE])

Johys

Kaki Physician
Service(s) Provided

Indications

13 weeks gestation of pregnancy
First trimester aneuploidy screen (NT)                Z36
Poor obstetric history: Previous gestational
diabetes
Fetal Evaluation

Num Of Fetuses:    1
Fetal Heart Rate:  157                          bpm
Cardiac Activity:  Observed
Presentation:      Breech
Placenta:          Anterior

Amniotic Fluid
AFI FV:      Subjectively within normal limits
Gestational Age

LMP:           13w 2d        Date:  03/02/14                 EDD:   12/07/14
Best:          13w 2d     Det. By:  LMP  (03/02/14)          EDD:   12/07/14
1st Trimester Genetic Sonogram Screening

CRL:            78.6  mm    G. Age:   13w 4d                 EDD:   12/05/14
Nuc Trans:       1.9  mm
Nasal Bone:                 Present
Anatomy

Cranium:          Appears normal         Bladder:          Appears normal
Choroid Plexus:   Appears normal         Lower             Visualized
Extremities:
Stomach:          Appears normal, left   Upper             Visualized
sided                  Extremities:
Cord Vessels:     Appears normal (3
vessel cord)
Cervix Uterus Adnexa

Cervix:       Normal appearance by transabdominal scan.

Left Ovary:    Within normal limits.
Right Ovary:   Within normal limits.
Adnexa:     No abnormality visualized.
Impression

SIUP at 13+2 weeks
No gross abnormalities identified
NT measurement was within normal limits for this GA; NB
present
Normal amniotic fluid volume
Measurements consistent with LMP dating

After additional counseling, Ms. Gwin declined
screening for aneuploidy.
Recommendations

Offer MSAFP in the second trimester for ONTD screening
Offer anatomy U/S by 18 weeks

ROSELLON with us.  Please do not hesitate to

## 2015-11-04 IMAGING — US US OB COMP +14 WK
1 series · 12 of 28 positions shown · non-contrast
Comparison: none

[Series 1: us ob +14 all · 84 acquisitions, 12 frames shown]
[im 4/84]
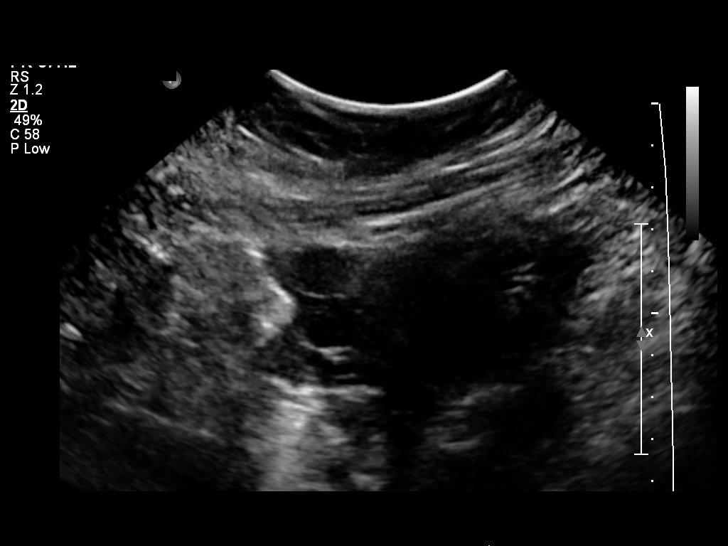
[im 10/84]
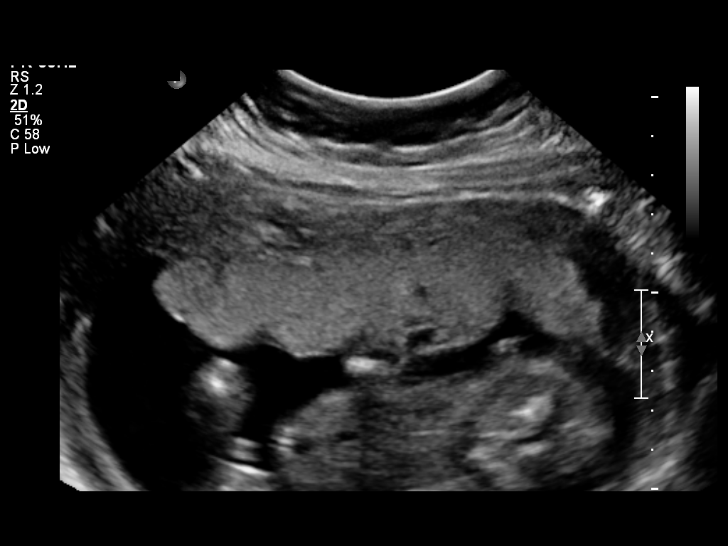
[im 16/84]
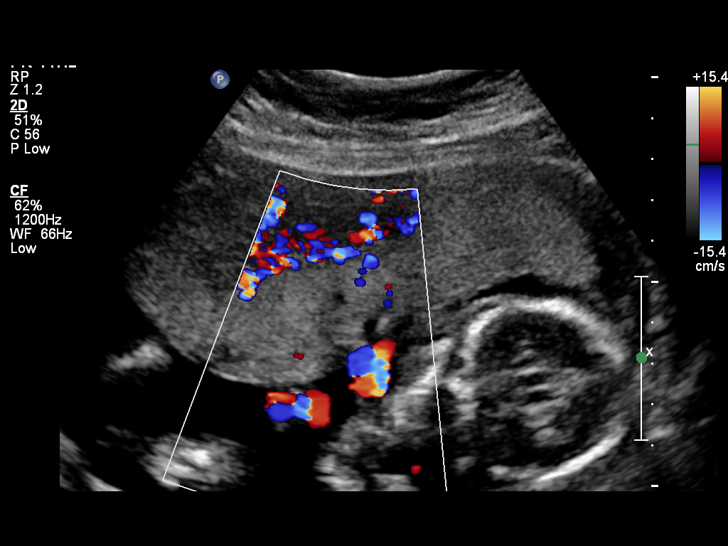
[im 25/84]
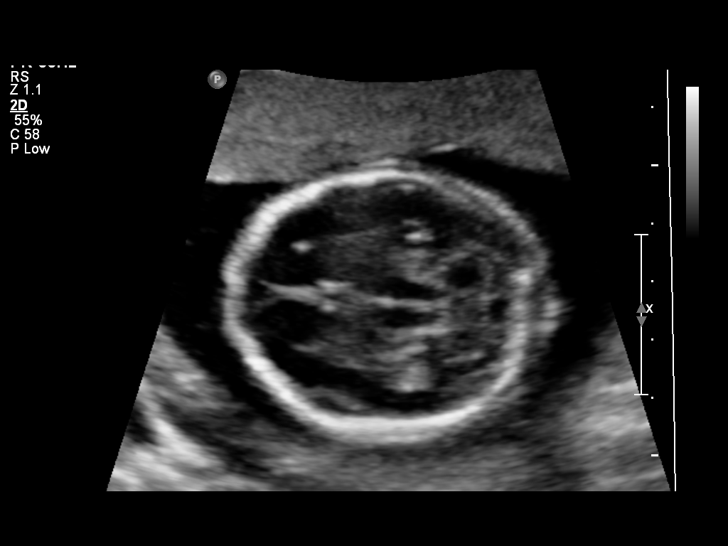
[im 31/84]
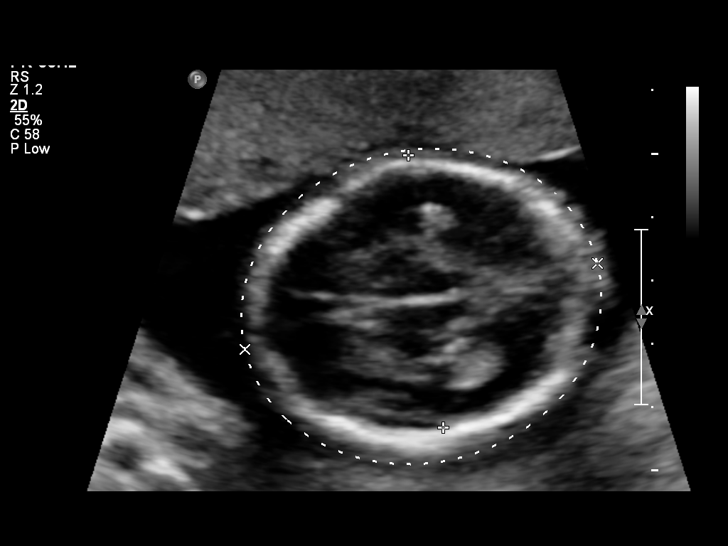
[im 37/84]
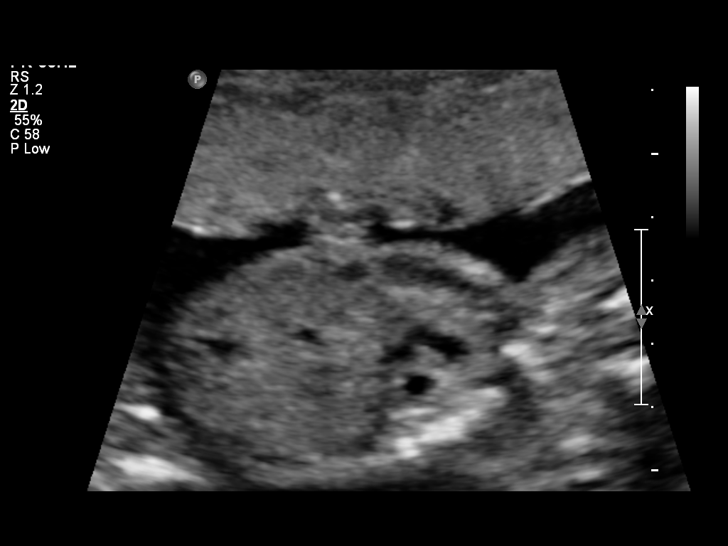
[im 47/84]
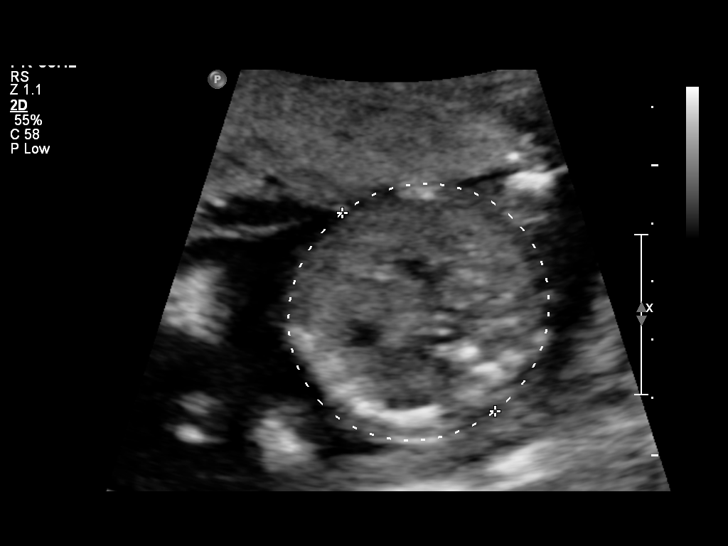
[im 53/84]
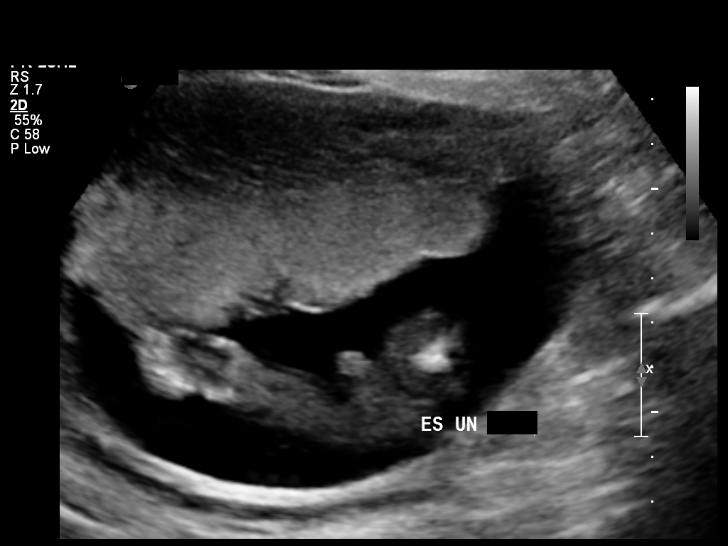
[im 59/84]
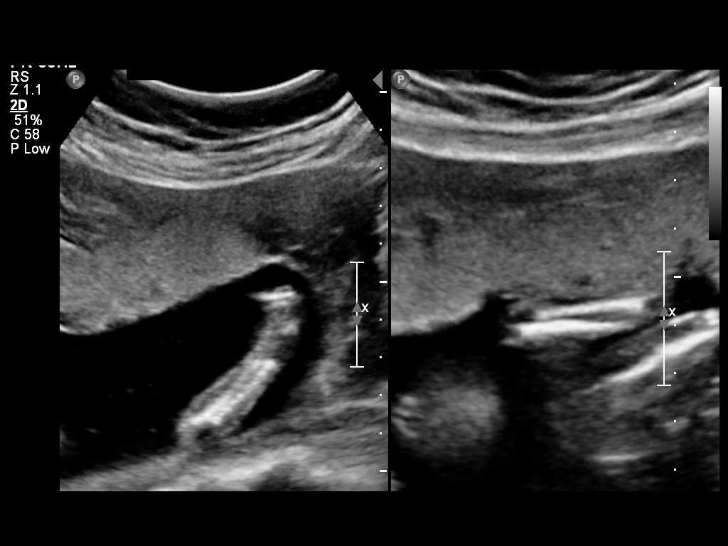
[im 68/84]
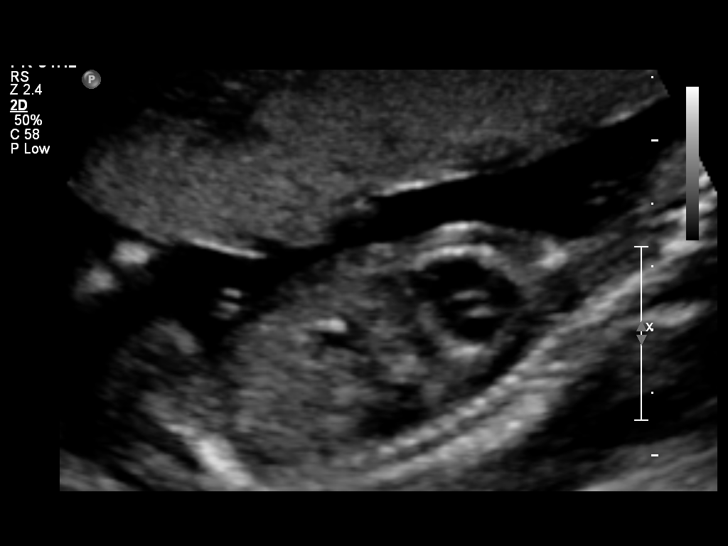
[im 74/84]
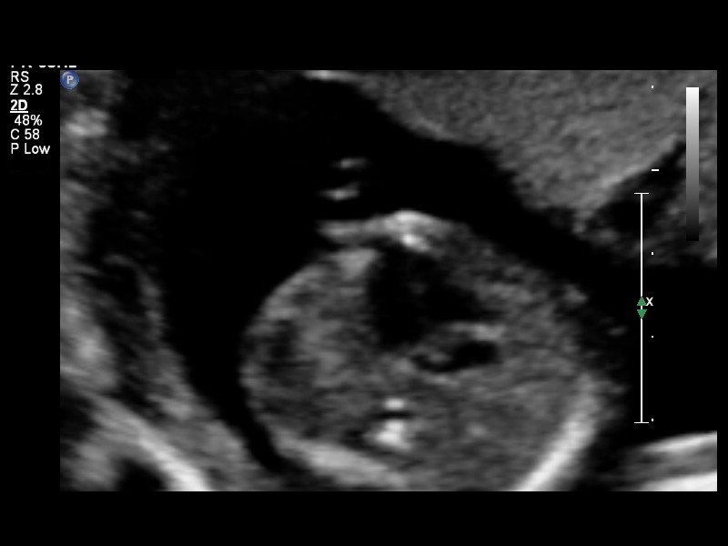
[im 80/84]
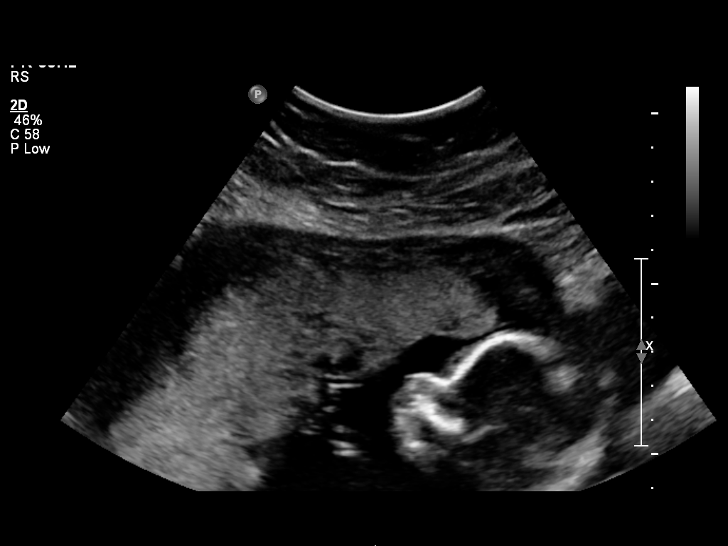

[12 of 28 positions shown; findings below may reference images not displayed]

OBSTETRICS REPORT
(Signed Final 07/09/2014 [DATE])

Name:       JUVENAL CHONG                      Visit  07/09/2014 [DATE]
BARILLAS                             Date:

By:
Service(s) Provided

US OB COMP + 14 WK                                     76805.1
Indications

19 weeks gestation of pregnancy
Basic anatomic survey                                  z36
Fetal Evaluation

Num Of             1
Fetuses:
Fetal Heart        144                          bpm
Rate:
Cardiac Activity:  Observed
Presentation:      Cephalic
Placenta:          Anterior, above cervical
os
P. Cord            Previously Visualized
Insertion:

Amniotic Fluid
AFI FV:      Subjectively within normal limits
Larg Pckt:      4.6  cm
Biometry

BPD:     43.4   m    G. Age:   19w 1d                 CI:        70.49   70 - 86
m
FL/HC:      18.9   15.8 -
18
HC:     164.8   m    G. Age:   19w 1d        78  %    HC/AC:      1.16   1.07 -
m
AC:     141.9   m    G. Age:   19w 4d        81  %    FL/BPD
m                                     :
FL:      31.1   m    G. Age:   19w 4d        83  %    FL/AC:      21.9   20 - 24
m
HUM:     28.2   m    G. Age:   19w 1d        71  %
m
CER:       20   m    G. Age:   19w 0d        70  %
m
NFT:     4.67   m
m

Est.         299   gm   0 lb 11 oz      64   %
FW:
Gestational Age

LMP:           18w 3d        Date:  03/02/14                  EDD:   12/07/14
U/S Today:     19w 2d                                         EDD:   12/01/14
Best:          18w 3d    Det. By:   LMP  (03/02/14)           EDD:   12/07/14
Anatomy

Cranium:          Appears normal         Aortic Arch:       Appears normal
Fetal Cavum:      Appears normal         Ductal Arch:       Appears normal
Ventricles:       Appears normal         Diaphragm:         Appears normal
Choroid Plexus:   Appears normal         Stomach:           Appears normal,
left sided
Cerebellum:       Appears normal         Abdomen:           Appears normal
Posterior         Appears normal         Abdominal          Appears nml (cord
Fossa:                                   Wall:              insert, abd wall)
Nuchal Fold:      Appears normal         Cord Vessels:      Appears normal (3
vessel cord)
Face:             Appears normal         Kidneys:           Appear normal
(orbits and profile)
Lips:             Appears normal         Bladder:           Appears normal
Heart:            Appears normal         Spine:             Not well visualized
(4CH, axis, and
situs)
RVOT:             Appears normal         Lower              Appears normal
Extremities:
LVOT:             Appears normal         Upper              Appears normal
Extremities:

Other:   Fetus appears to be a male. Heels visualized. Technically difficult
due to fetal position.
Targeted Anatomy

Fetal Central Nervous System
Cisterna
Magna:
Cervix Uterus Adnexa

Cervical Length:    2.79      cm

Cervix:       Normal appearance by transabdominal scan.

Left Ovary:    Size(cm) L: 1.91 x W: 1.85 x H: 1.03  Volume(cc):
1.9
Right Ovary:   Size(cm) L: 1.94 x W: 1.47 x H: 1.09  Volume(cc):
1.6
Impression

Single IUP at 18w 3d
Limited views of the fetal spine obtained due to fetal position
The remainder of the fetal anatomy appears normal
Ultrasound measurements consistent with dates
Normal amniotic fluid volume
Recommendations

Recommend follow-up ultrasound examination in 4 weeks
to reevaluate the fetal spine.

GIORGI with us.  Please do not hesitate to

## 2017-09-11 ENCOUNTER — Other Ambulatory Visit: Payer: Self-pay

## 2017-09-11 ENCOUNTER — Emergency Department (HOSPITAL_COMMUNITY)
Admission: EM | Admit: 2017-09-11 | Discharge: 2017-09-12 | Disposition: A | Discharge status: Home / Self Care | Payer: Self-pay | Attending: Emergency Medicine | Admitting: Emergency Medicine

## 2017-09-11 ENCOUNTER — Encounter (HOSPITAL_COMMUNITY): Payer: Self-pay | Admitting: Emergency Medicine

## 2017-09-11 DIAGNOSIS — K8050 Calculus of bile duct without cholangitis or cholecystitis without obstruction: Secondary | ICD-10-CM | POA: Insufficient documentation

## 2017-09-11 DIAGNOSIS — Z79899 Other long term (current) drug therapy: Secondary | ICD-10-CM | POA: Insufficient documentation

## 2017-09-11 DIAGNOSIS — J45909 Unspecified asthma, uncomplicated: Secondary | ICD-10-CM | POA: Insufficient documentation

## 2017-09-11 DIAGNOSIS — R1010 Upper abdominal pain, unspecified: Secondary | ICD-10-CM | POA: Insufficient documentation

## 2017-09-11 DIAGNOSIS — R1011 Right upper quadrant pain: Secondary | ICD-10-CM

## 2017-09-11 LAB — COMPREHENSIVE METABOLIC PANEL
ALBUMIN: 3.6 g/dL (ref 3.5–5.0)
ALT: 14 U/L (ref 0–44)
AST: 19 U/L (ref 15–41)
Alkaline Phosphatase: 77 U/L (ref 38–126)
Anion gap: 8 (ref 5–15)
BILIRUBIN TOTAL: 0.5 mg/dL (ref 0.3–1.2)
BUN: 12 mg/dL (ref 6–20)
CHLORIDE: 105 mmol/L (ref 98–111)
CO2: 26 mmol/L (ref 22–32)
Calcium: 9.3 mg/dL (ref 8.9–10.3)
Creatinine, Ser: 0.63 mg/dL (ref 0.44–1.00)
GFR calc Af Amer: >60 mL/min (ref >60)
GFR calc non Af Amer: >60 mL/min (ref >60)
GLUCOSE: 92 mg/dL (ref 70–99)
Potassium: 3.6 mmol/L (ref 3.5–5.1)
Sodium: 139 mmol/L (ref 135–145)
TOTAL PROTEIN: 7.4 g/dL (ref 6.5–8.1)

## 2017-09-11 LAB — CBC
HEMATOCRIT: 37.5 % (ref 36.0–46.0)
Hemoglobin: 12.4 g/dL (ref 12.0–15.0)
MCH: 29.2 pg (ref 26.0–34.0)
MCHC: 33.1 g/dL (ref 30.0–36.0)
MCV: 88.4 fL (ref 78.0–100.0)
Platelets: 322 10*3/uL (ref 150–400)
RBC: 4.24 MIL/uL (ref 3.87–5.11)
RDW: 12.8 % (ref 11.5–15.5)
WBC: 12 10*3/uL — ABNORMAL HIGH (ref 4.0–10.5)

## 2017-09-11 LAB — URINALYSIS, ROUTINE W REFLEX MICROSCOPIC
Bilirubin Urine: NEGATIVE
Glucose, UA: NEGATIVE mg/dL
Hgb urine dipstick: NEGATIVE
Ketones, ur: NEGATIVE mg/dL
Leukocytes, UA: NEGATIVE
Nitrite: NEGATIVE
PH: 6 (ref 5.0–8.0)
Protein, ur: NEGATIVE mg/dL
SPECIFIC GRAVITY, URINE: 1.003 — AB (ref 1.005–1.030)

## 2017-09-11 LAB — LIPASE, BLOOD: Lipase: 40 U/L (ref 11–51)

## 2017-09-11 LAB — I-STAT BETA HCG BLOOD, ED (MC, WL, AP ONLY): I-stat hCG, quantitative: <5 m[IU]/mL (ref <5)

## 2017-09-11 NOTE — ED Triage Notes (Signed)
Pt reports abdominal pain that started Sunday "feels like gas, won't stop"  Denies N/V/D or weakness.  OTC medications not relieving the pain, now moving to back.

## 2017-09-12 ENCOUNTER — Emergency Department (HOSPITAL_COMMUNITY): Payer: Self-pay

## 2017-09-12 MED ORDER — HYDROCODONE-ACETAMINOPHEN 5-325 MG PO TABS: 1.0000 | ORAL_TABLET | Freq: Four times a day (QID) | ORAL | 0 refills | Status: DC | PRN

## 2017-09-12 MED ORDER — GI COCKTAIL ~~LOC~~
30.0000 mL | Freq: Once | ORAL | Status: AC
  Administered 2017-09-12: 30 mL via ORAL
  Filled 2017-09-12: qty 30

## 2017-09-12 MED ORDER — OXYCODONE-ACETAMINOPHEN 5-325 MG PO TABS
1.0000 | ORAL_TABLET | Freq: Once | ORAL | Status: AC
  Administered 2017-09-12: 1 via ORAL
  Filled 2017-09-12: qty 1

## 2017-09-12 NOTE — ED Notes (Signed)
Patient transported to Ultrasound 

## 2017-09-12 NOTE — ED Provider Notes (Signed)
[facility] EMERGENCY DEPARTMENT Provider Note   CSN: [id number] Arrival date & time: 09/11/17  2238     History   Chief Complaint Chief Complaint  Patient presents with  . Abdominal Pain    HPI Deanna Harrison is a 27 y.o. female.  HPI  27 year old female who presents with abdominal pain.  Patient reports intermittent upper abdominal pain.  Is worse with eating.  She describes it as sharp.  It times radiates to her back.  Currently her pain is 6 out of 10.  She will sometimes take an antacid and get some relief of her pain.  She denies any nausea, vomiting, urinary symptoms, fevers.  She reports normal bowel movements.  Past Medical History:  Diagnosis Date  . Asthma   . GERD (gastroesophageal reflux disease)   . Gestational diabetes     Patient Active Problem List   Diagnosis Date Noted  . Active labor 11/23/2014  . NSVD (normal spontaneous vaginal delivery) 11/23/2014  . Second-degree perineal laceration, with delivery 11/23/2014  . Encounter for fetal anatomic survey   . [redacted] weeks gestation of pregnancy   . [redacted] weeks gestation of pregnancy   . Encounter for (NT) nuchal translucency scan     Past Surgical History:  Procedure Laterality Date  . NO PAST SURGERIES       OB History    Gravida  3   Para  2   Term  2   Preterm      AB  1   Living  2     SAB  1   TAB      Ectopic      Multiple  0   Live Births  2            Home Medications    Prior to Admission medications   Medication Sig Start Date End Date Taking? Authorizing Provider  HYDROcodone-acetaminophen (NORCO/VICODIN) 5-325 MG tablet Take 1 tablet by mouth every 6 (six) hours as needed. 09/12/17   Horton, Mayer Masker, MD  ibuprofen (ADVIL,MOTRIN) 600 MG tablet Take 1 tablet (600 mg total) by mouth every 6 (six) hours. 11/24/14   Casey Burkitt, MD  Prenatal Vit-Fe Fumarate-FA (PRENATAL MULTIVITAMIN) TABS tablet Take 1 tablet by mouth daily at 12  noon.    [provider]    Family History No family history on file.  Social History Social History   Tobacco Use  . Smoking status: Never Smoker  . Smokeless tobacco: Never Used  Substance Use Topics  . Alcohol use: No  . Drug use: No     Allergies   Patient has no known allergies.   Review of Systems Review of Systems  Constitutional: Negative for fever.  Respiratory: Negative for shortness of breath.   Cardiovascular: Negative for chest pain.  Gastrointestinal: Positive for abdominal pain. Negative for diarrhea, nausea and vomiting.  Genitourinary: Negative for dysuria.  All other systems reviewed and are negative.    Physical Exam Updated Vital Signs BP 104/70   Pulse 68   Temp 98.6 F (37 C) (Oral)   Resp 16   SpO2 100%   Physical Exam  Constitutional: She is oriented to person, place, and time. She appears well-developed and well-nourished.  Overweight, nontoxic-appearing  HENT:  Head: Normocephalic and atraumatic.  Neck: Neck supple.  Cardiovascular: Normal rate, regular rhythm and normal heart sounds.  Pulmonary/Chest: Effort normal. No respiratory distress. She has no wheezes.  Abdominal: Soft. Bowel sounds are normal.  There is tenderness in the right upper quadrant and epigastric area. There is no rebound and no guarding.  Neurological: She is alert and oriented to person, place, and time.  Skin: Skin is warm and dry.  Psychiatric: She has a normal mood and affect.  Nursing note and vitals reviewed.    ED Treatments / Results  Labs (all labs ordered are listed, but only abnormal results are displayed) Labs Reviewed  CBC - Abnormal; Notable for the following components:      Result Value   WBC 12.0 (*)    All other components within normal limits  URINALYSIS, ROUTINE W REFLEX MICROSCOPIC - Abnormal; Notable for the following components:   Color, Urine STRAW (*)    Specific Gravity, Urine 1.003 (*)    Bacteria, UA RARE (*)    All  other components within normal limits  LIPASE, BLOOD  COMPREHENSIVE METABOLIC PANEL  I-STAT BETA HCG BLOOD, ED ([facility], [facility], [facility] ONLY)    EKG None  Radiology US Abdomen Limited Ruq  Result Date: 09/12/2017 CLINICAL DATA:  Right upper quadrant pain for 3 days. EXAM: ULTRASOUND ABDOMEN LIMITED RIGHT UPPER QUADRANT COMPARISON:  None. FINDINGS: Gallbladder: There is a nonmobile stone in the gallbladder neck measuring 1.7 cm diameter. No gallbladder wall thickening, edema, sludge, and no additional stones. Murphy's sign is negative. Common bile duct: Diameter: 3.4 mm, normal Liver: No focal lesion identified. Within normal limits in parenchymal echogenicity. Portal vein is patent on color Doppler imaging with normal direction of blood flow towards the liver. IMPRESSION: Cholelithiasis with stone in the gallbladder neck. No additional changes to suggest cholecystitis. Electronically Signed   By: Burman Nieves M.D.   On: 09/12/2017 04:20    Procedures Procedures (including critical care time)  Medications Ordered in ED Medications  gi cocktail (Maalox,Lidocaine,Donnatal) (30 mLs Oral Given 09/12/17 0316)  oxyCODONE-acetaminophen (PERCOCET/ROXICET) 5-325 MG per tablet 1 tablet (1 tablet Oral Given 09/12/17 0316)     Initial Impression / Assessment and Plan / ED Course  I have reviewed the triage vital signs and the nursing notes.  Pertinent labs & imaging results that were available during my care of the patient were reviewed by me and considered in my medical decision making (see chart for details).     Patient presents with upper abdominal pain.  She is overall nontoxic-appearing and vital signs are reassuring.  She does have some tenderness in the epigastrium and right upper quadrant.  Given some improvement with antacids, peptic ulcers or gastritis or considerations.  Additionally, gallbladder disease is another possibility.  Lab work-up is largely reassuring including lipase and LFTs.  Patient  was given a GI cocktail and Percocet.  Right upper quadrant ultrasound shows cholelithiasis without evidence of cholecystitis.  I discussed this with the patient.  We will have her follow-up with general surgery.  She was given a short course of pain medication.  Also to continue antacids.  After history, exam, and medical workup I feel the patient has been appropriately medically screened and is safe for discharge home. Pertinent diagnoses were discussed with the patient. Patient was given return precautions.   Final Clinical Impressions(s) / ED Diagnoses   Final diagnoses:  RUQ pain  Biliary colic  Pain of upper abdomen    ED Discharge Orders         Ordered    HYDROcodone-acetaminophen (NORCO/VICODIN) 5-325 MG tablet  Every 6 hours PRN     09/12/17 0428  Shon Baton, MD 09/12/17 [phone]

## 2017-09-12 NOTE — ED Notes (Signed)
PT states understanding of care given, follow up care, and medication prescribed. PT ambulated from ED to car with a steady gait. 

## 2018-01-27 ENCOUNTER — Ambulatory Visit (HOSPITAL_COMMUNITY)
Admission: EM | Admit: 2018-01-27 | Discharge: 2018-01-27 | Disposition: A | Discharge status: Home / Self Care | Payer: Self-pay | Attending: Family Medicine | Admitting: Family Medicine

## 2018-01-27 ENCOUNTER — Encounter (HOSPITAL_COMMUNITY): Payer: Self-pay | Admitting: Emergency Medicine

## 2018-01-27 ENCOUNTER — Other Ambulatory Visit: Payer: Self-pay

## 2018-01-27 DIAGNOSIS — G44319 Acute post-traumatic headache, not intractable: Secondary | ICD-10-CM

## 2018-01-27 DIAGNOSIS — M25511 Pain in right shoulder: Secondary | ICD-10-CM

## 2018-01-27 MED ORDER — IBUPROFEN 800 MG PO TABS: 800.0000 mg | ORAL_TABLET | Freq: Three times a day (TID) | ORAL | 0 refills | Status: DC

## 2018-01-27 MED ORDER — TIZANIDINE HCL 4 MG PO TABS: 4.0000 mg | ORAL_TABLET | Freq: Four times a day (QID) | ORAL | 0 refills | Status: DC | PRN
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
## 2018-01-27 NOTE — Discharge Instructions (Signed)
Rest Use ice or heat to the painful shoulder area Take ibuprofen 3 times a day with food Take the tizanidine if needed as muscle relaxer This is useful at nighttime See your primary care doctor if not improving by next week Return here or to the ER at any time if worse instead of better

## 2018-02-28 ENCOUNTER — Encounter (HOSPITAL_COMMUNITY): Payer: Self-pay

## 2018-02-28 ENCOUNTER — Ambulatory Visit (HOSPITAL_COMMUNITY)
Admission: EM | Admit: 2018-02-28 | Discharge: 2018-02-28 | Disposition: A | Discharge status: Home / Self Care | Payer: Self-pay | Attending: Internal Medicine | Admitting: Internal Medicine

## 2018-02-28 DIAGNOSIS — M25511 Pain in right shoulder: Secondary | ICD-10-CM

## 2018-02-28 DIAGNOSIS — G5601 Carpal tunnel syndrome, right upper limb: Secondary | ICD-10-CM

## 2018-02-28 HISTORY — DX: Cholecystitis, unspecified: K81.9

## 2018-02-28 MED ORDER — TIZANIDINE HCL 4 MG PO TABS: 4.0000 mg | ORAL_TABLET | Freq: Four times a day (QID) | ORAL | 0 refills | Status: AC | PRN

## 2018-02-28 MED ORDER — IBUPROFEN 800 MG PO TABS: 800.0000 mg | ORAL_TABLET | Freq: Three times a day (TID) | ORAL | 0 refills | Status: AC
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
## 2019-06-29 IMAGING — US US ABDOMEN LIMITED
1 series · 14 of 25 positions shown · non-contrast
Comparison: None.

CLINICAL DATA: Right upper quadrant pain for 3 days.

EXAM:
ULTRASOUND ABDOMEN LIMITED RIGHT UPPER QUADRANT

[Series 1: us abdomen limited · 0.28mm/px · 14 of 53 slices shown]
[im 1/53]
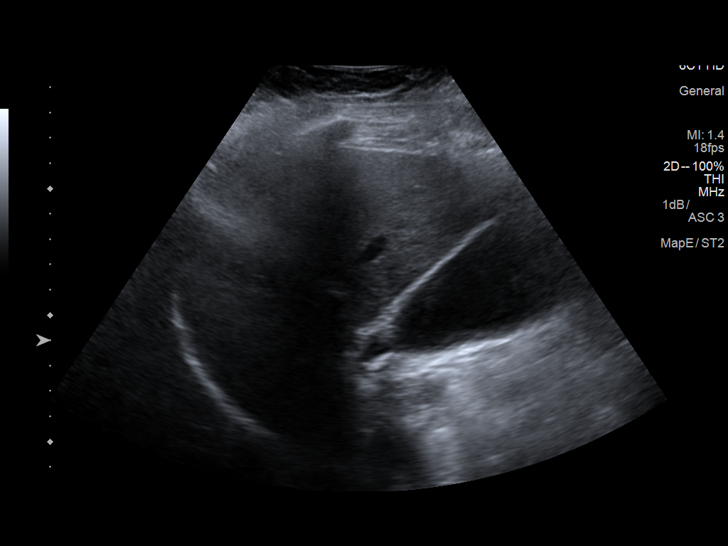
[im 5/53]
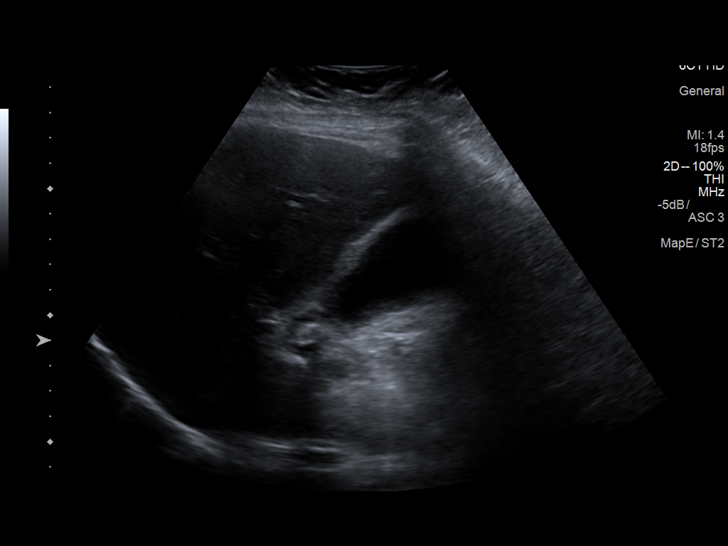
[im 9/53]
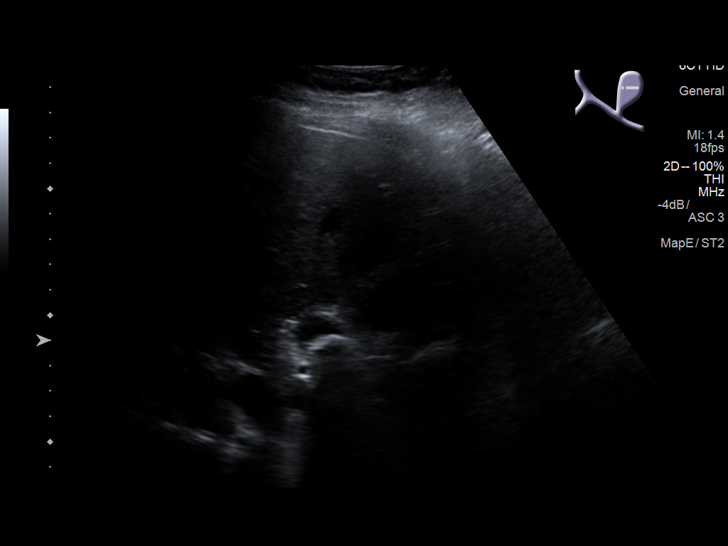
[im 14/53]
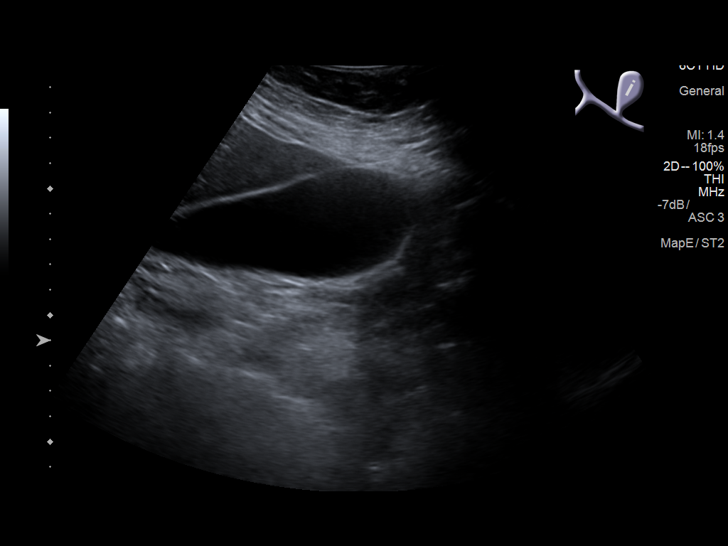
[im 18/53]
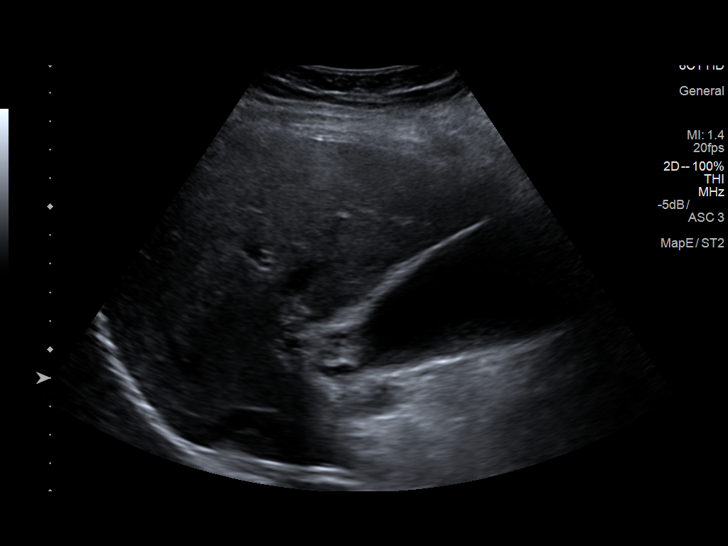
[im 20/53]
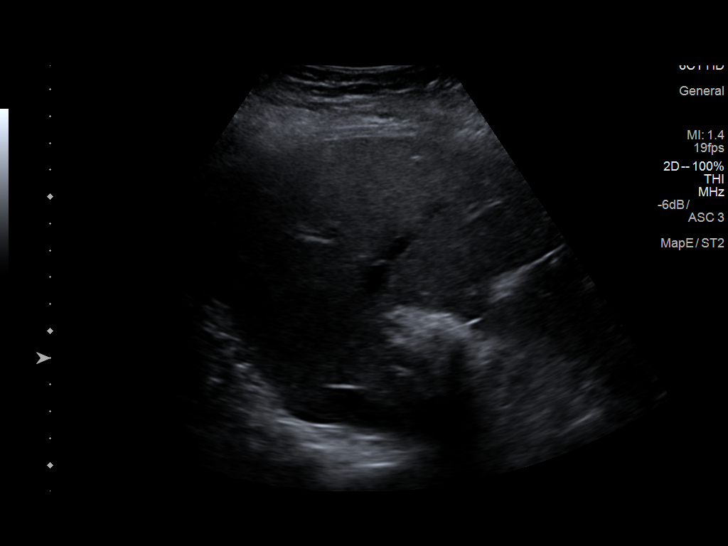
[im 24/53]
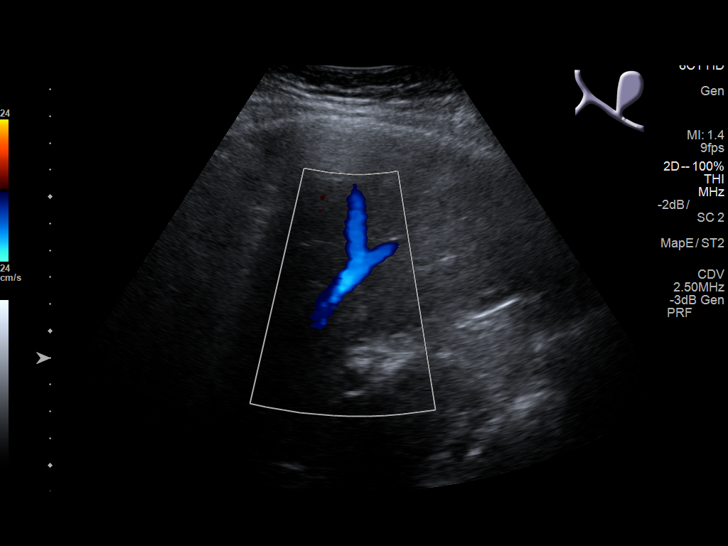
[im 29/53]
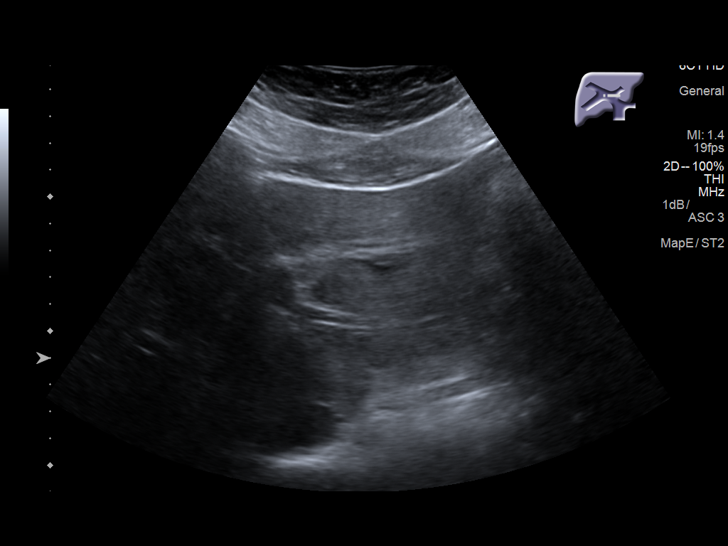
[im 33/53]
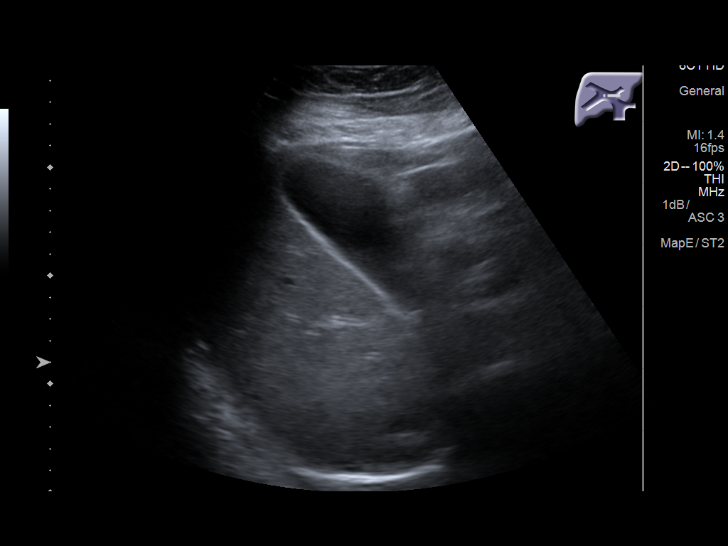
[im 35/53]
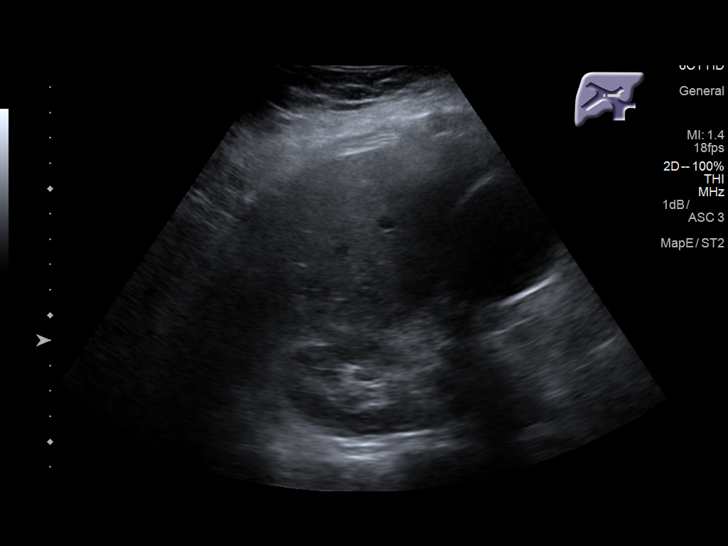
[im 40/53]
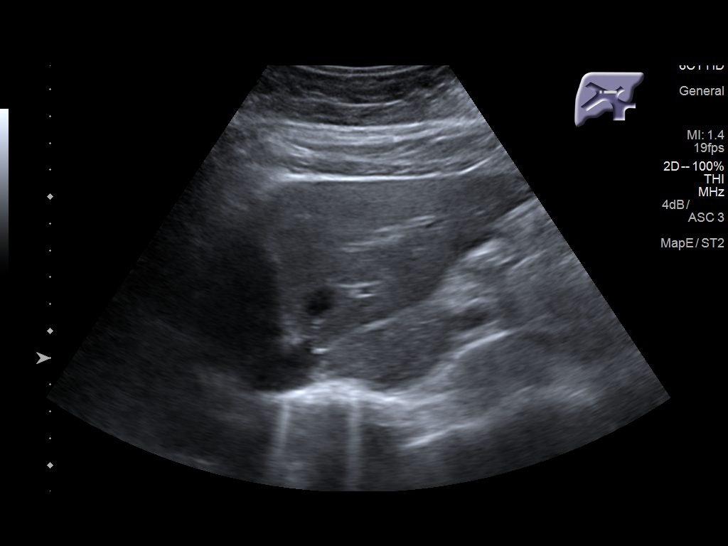
[im 44/53]
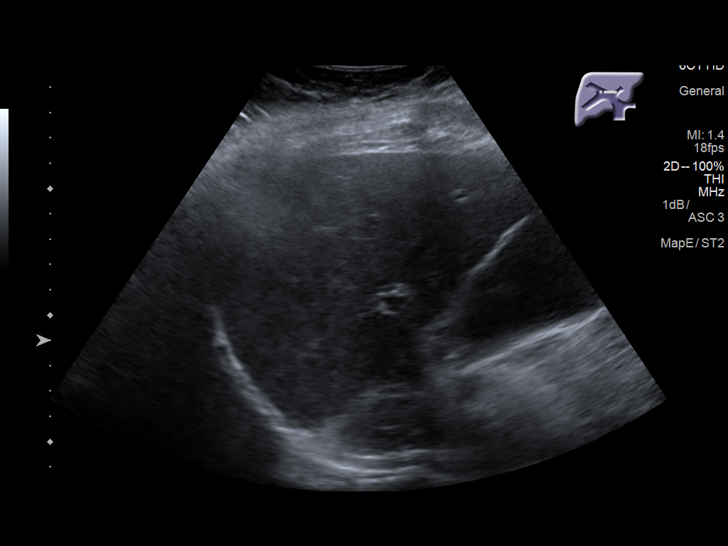
[im 48/53]
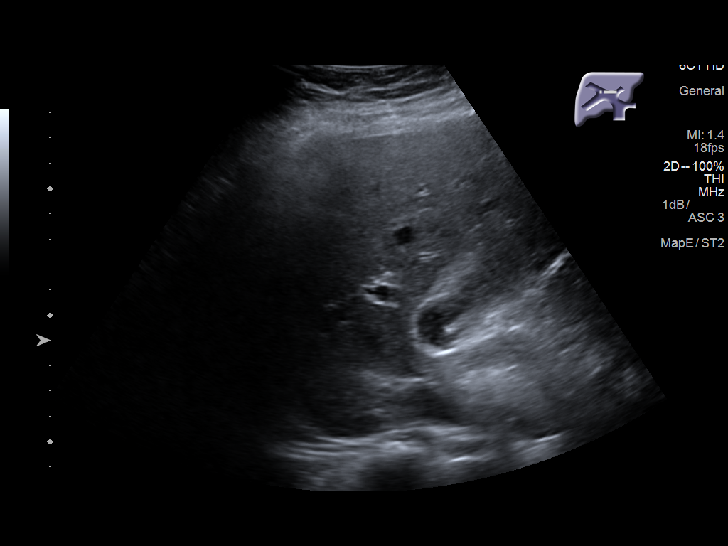
[im 53/53]
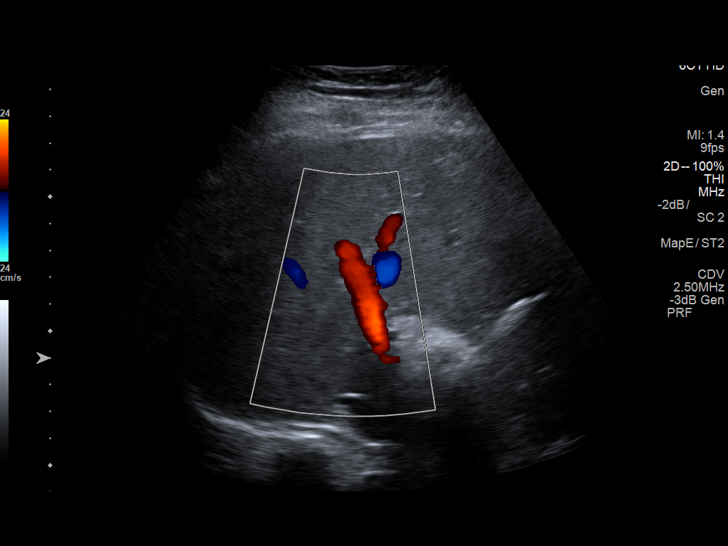

[14 of 25 positions shown; findings below may reference images not displayed]

FINDINGS: Gallbladder:

There is a nonmobile stone in the gallbladder neck measuring 1.7 cm
diameter. No gallbladder wall thickening, edema, sludge, and no
additional stones. Murphy's sign is negative.

Common bile duct:

Diameter: 3.4 mm, normal

Liver:

No focal lesion identified. Within normal limits in parenchymal
echogenicity. Portal vein is patent on color Doppler imaging with
normal direction of blood flow towards the liver.
IMPRESSION: Cholelithiasis with stone in the gallbladder neck. No additional
changes to suggest cholecystitis.

## 2023-04-13 ENCOUNTER — Emergency Department (HOSPITAL_COMMUNITY): Payer: Self-pay

## 2023-04-13 ENCOUNTER — Encounter (HOSPITAL_COMMUNITY): Payer: Self-pay

## 2023-04-13 ENCOUNTER — Emergency Department (HOSPITAL_COMMUNITY)
Admission: EM | Admit: 2023-04-13 | Discharge: 2023-04-13 | Disposition: A | Discharge status: Home / Self Care | Payer: Self-pay | Attending: Emergency Medicine | Admitting: Emergency Medicine

## 2023-04-13 ENCOUNTER — Other Ambulatory Visit: Payer: Self-pay

## 2023-04-13 DIAGNOSIS — O209 Hemorrhage in early pregnancy, unspecified: Secondary | ICD-10-CM | POA: Insufficient documentation

## 2023-04-13 DIAGNOSIS — O4690 Antepartum hemorrhage, unspecified, unspecified trimester: Secondary | ICD-10-CM

## 2023-04-13 DIAGNOSIS — Z3A01 Less than 8 weeks gestation of pregnancy: Secondary | ICD-10-CM | POA: Insufficient documentation

## 2023-04-13 LAB — BASIC METABOLIC PANEL WITH GFR
Anion gap: 8 (ref 5–15)
BUN: 15 mg/dL (ref 6–20)
CO2: 23 mmol/L (ref 22–32)
Calcium: 9.2 mg/dL (ref 8.9–10.3)
Chloride: 106 mmol/L (ref 98–111)
Creatinine, Ser: 0.62 mg/dL (ref 0.44–1.00)
GFR, Estimated: >60 mL/min (ref >60)
Glucose, Bld: 109 mg/dL — ABNORMAL HIGH (ref 70–99)
Potassium: 3.5 mmol/L (ref 3.5–5.1)
Sodium: 137 mmol/L (ref 135–145)

## 2023-04-13 LAB — HCG, QUANTITATIVE, PREGNANCY: hCG, Beta Chain, Quant, S: 21711 m[IU]/mL — ABNORMAL HIGH (ref <5)

## 2023-04-13 LAB — CBC WITH DIFFERENTIAL/PLATELET
Abs Immature Granulocytes: 0.07 10*3/uL (ref 0.00–0.07)
Basophils Absolute: 0 10*3/uL (ref 0.0–0.1)
Basophils Relative: 0 %
Eosinophils Absolute: 0.1 10*3/uL (ref 0.0–0.5)
Eosinophils Relative: 1 %
HCT: 35.6 % — ABNORMAL LOW (ref 36.0–46.0)
Hemoglobin: 11.8 g/dL — ABNORMAL LOW (ref 12.0–15.0)
Immature Granulocytes: 1 %
Lymphocytes Relative: 23 %
Lymphs Abs: 2.2 10*3/uL (ref 0.7–4.0)
MCH: 28.9 pg (ref 26.0–34.0)
MCHC: 33.1 g/dL (ref 30.0–36.0)
MCV: 87.3 fL (ref 80.0–100.0)
Monocytes Absolute: 0.6 10*3/uL (ref 0.1–1.0)
Monocytes Relative: 6 %
Neutro Abs: 6.6 10*3/uL (ref 1.7–7.7)
Neutrophils Relative %: 69 %
Platelets: 318 10*3/uL (ref 150–400)
RBC: 4.08 MIL/uL (ref 3.87–5.11)
RDW: 13.2 % (ref 11.5–15.5)
WBC: 9.7 10*3/uL (ref 4.0–10.5)
nRBC: 0 % (ref 0.0–0.2)

## 2023-04-13 NOTE — Discharge Instructions (Addendum)
 You need to follow up at [facility] admissions for recheck in 1 week.  [facility]  if [facility] or bleeding.  Return if any problems.

## 2023-04-13 NOTE — ED Triage Notes (Signed)
 [redacted] weeks pregnant, has been having light pink spotting intermittently since Wednesday. LLQ abdominal cramping. G4P2

## 2023-04-13 NOTE — ED Provider Notes (Signed)
 [facility] AT [facility] Provider Note   CSN: [id number] Arrival date & time: 04/13/23  1526     History  Chief Complaint  Patient presents with   Vaginal Bleeding    Deanna Harrison is a 33 y.o. female.  Patient complains of early pregnancy and vaginal bleeding.  Patient reports that she recently had a positive pregnancy test and was told that she is [redacted] weeks pregnant.  Patient reports that she noticed blood yesterday and today with wiping.  Patient reports she only saw a small amount of blood.  Patient states she has had a crampy sensation in her lower abdomen.  Patient denies pain she has not had any nausea or vomiting.  Patient denies any weakness.  Patient has not had any prenatal care.  The history is provided by the patient. No language interpreter was used.  Vaginal Bleeding      Home Medications Prior to Admission medications   Medication Sig Start Date End Date Taking? Authorizing Provider  ibuprofen (ADVIL,MOTRIN) 800 MG tablet Take 1 tablet (800 mg total) by mouth 3 (three) times daily. 02/28/18   Lamptey, Britta Mccreedy, MD  tiZANidine (ZANAFLEX) 4 MG tablet Take 1-2 tablets (4-8 mg total) by mouth every 6 (six) hours as needed for muscle spasms. 02/28/18   LampteyBritta Mccreedy, MD      Allergies    Patient has no known allergies.    Review of Systems   Review of Systems  Genitourinary:  Positive for vaginal bleeding.  All other systems reviewed and are negative.   Physical Exam Updated Vital Signs BP 105/69 (BP Location: Left Arm)   Pulse 81   Temp 98.3 F (36.8 C) (Oral)   Resp 16   Ht 5\' 1"  (1.549 m)   Wt 94.8 kg   SpO2 100%   BMI 39.49 kg/m  Physical Exam Vitals and nursing note reviewed.  Constitutional:      Appearance: She is well-developed.  HENT:     Head: Normocephalic.  Cardiovascular:     Rate and Rhythm: Normal rate.  Pulmonary:     Effort: Pulmonary effort is normal.  Abdominal:     General:  Abdomen is flat. There is no distension.     Palpations: Abdomen is soft.     Tenderness: There is no abdominal tenderness.  Musculoskeletal:        General: Normal range of motion.     Cervical back: Normal range of motion.  Skin:    General: Skin is warm.  Neurological:     General: No focal deficit present.     Mental Status: She is alert and oriented to person, place, and time.  Psychiatric:        Mood and Affect: Mood normal.     ED Results / Procedures / Treatments   Labs (all labs ordered are listed, but only abnormal results are displayed) Labs Reviewed  CBC WITH DIFFERENTIAL/PLATELET - Abnormal; Notable for the following components:      Result Value   Hemoglobin 11.8 (*)    HCT 35.6 (*)    All other components within normal limits  HCG, QUANTITATIVE, PREGNANCY - Abnormal; Notable for the following components:   hCG, Beta Chain, Quant, S 21,711 (*)    All other components within normal limits  BASIC METABOLIC PANEL WITH GFR - Abnormal; Notable for the following components:   Glucose, Bld 109 (*)    All other components within normal limits  EKG None  Radiology US OB Comp Less 14 Wks Result Date: 04/13/2023 CLINICAL DATA:  Pelvic pain in 1st trimester pregnancy. EXAM: OBSTETRIC <14 WK [place] AND TRANSVAGINAL OB US TECHNIQUE: Both transabdominal and transvaginal ultrasound examinations were performed for complete evaluation of the gestation as well as the maternal uterus, adnexal regions, and pelvic cul-de-sac. Transvaginal technique was performed to assess early pregnancy. COMPARISON:  None Available. FINDINGS: Intrauterine gestational sac: Single, with irregular sac shape and internal echogenic debris noted Yolk sac:  Not Visualized. Embryo:  Not Visualized. MSD: 7 mm   5 w   3 d Subchorionic hemorrhage:  None visualized. Maternal uterus/adnexae: Both ovaries are normal appearance. No mass or abnormal free fluid identified. IMPRESSION: Single approximately 5 week  intrauterine gestational sac which has abnormal appearance. Consider correlation with serial b-hCG levels, and followup ultrasound to assess viability in 7 days. Electronically Signed   By: Danae Orleans M.D.   On: 04/13/2023 19:26   US OB Transvaginal Result Date: 04/13/2023 CLINICAL DATA:  Pelvic pain in 1st trimester pregnancy. EXAM: OBSTETRIC <14 WK [place] AND TRANSVAGINAL OB US TECHNIQUE: Both transabdominal and transvaginal ultrasound examinations were performed for complete evaluation of the gestation as well as the maternal uterus, adnexal regions, and pelvic cul-de-sac. Transvaginal technique was performed to assess early pregnancy. COMPARISON:  None Available. FINDINGS: Intrauterine gestational sac: Single, with irregular sac shape and internal echogenic debris noted Yolk sac:  Not Visualized. Embryo:  Not Visualized. MSD: 7 mm   5 w   3 d Subchorionic hemorrhage:  None visualized. Maternal uterus/adnexae: Both ovaries are normal appearance. No mass or abnormal free fluid identified. IMPRESSION: Single approximately 5 week intrauterine gestational sac which has abnormal appearance. Consider correlation with serial b-hCG levels, and followup ultrasound to assess viability in 7 days. Electronically Signed   By: Danae Orleans M.D.   On: 04/13/2023 19:26    Procedures Procedures    Medications Ordered in ED Medications - No data to display  ED Course/ Medical Decision Making/ A&P                                 Medical Decision Making Patient complains of vaginal bleeding.  Patient is early pregnant.  Patient reports she is told that she is at 6 weeks.  Patient reports spotting only  Amount and/or Complexity of Data Reviewed Independent Historian: spouse    Details: Patient is here with her spouse who is supportive External Data Reviewed: labs.    Details: Labs from patient's previous pregnancy shows that her blood type is a positive. Labs: ordered. Decision-making details documented in ED  Course.    Details: Labs ordered reviewed and interpreted.  hCG is 21,711.  Hemoglobin is 11.8.  Glucose is 109. Radiology: ordered and independent interpretation performed. Decision-making details documented in ED Course.    Details: Ultrasound ordered reviewed and interpreted.  Ultrasound shows 5-week intrauterine gestational sac.  Radiology advised follow-up ultrasound in 1 week.  Risk Risk Details: Patient counseled on ultrasound findings.  She is advised to follow-up at maternity admissions for recheck in 1 week she is advised to go to maternity admissions if increased bleeding or increased pain.          Final Clinical Impression(s) / ED Diagnoses Final diagnoses:  Vaginal bleeding in pregnancy    Rx / DC Orders ED Discharge Orders     None      An After  Visit Summary was printed and given to the patient.    Osie Cheeks 04/13/23 2229    Wynetta Fines, MD 04/13/23 [phone]
